# Patient Record
Sex: Female | Born: 1955 | Hispanic: No | Marital: Married | Smoking: Never smoker
Health system: Southern US, Community
[De-identification: ages and names within clinical notes are randomized; demographics above are authoritative.]

## PROBLEM LIST (undated history)

## (undated) DIAGNOSIS — E039 Hypothyroidism, unspecified: Secondary | ICD-10-CM

## (undated) DIAGNOSIS — D649 Anemia, unspecified: Secondary | ICD-10-CM

## (undated) DIAGNOSIS — M109 Gout, unspecified: Secondary | ICD-10-CM

## (undated) DIAGNOSIS — E785 Hyperlipidemia, unspecified: Secondary | ICD-10-CM

## (undated) DIAGNOSIS — M1990 Unspecified osteoarthritis, unspecified site: Secondary | ICD-10-CM

## (undated) HISTORY — DX: Hypothyroidism, unspecified: E03.9

## (undated) HISTORY — DX: Anemia, unspecified: D64.9

## (undated) HISTORY — DX: Hyperlipidemia, unspecified: E78.5

## (undated) HISTORY — DX: Unspecified osteoarthritis, unspecified site: M19.90

## (undated) HISTORY — DX: Gout, unspecified: M10.9

## (undated) HISTORY — PX: NO PAST SURGERIES: SHX2092

---

## 2017-06-09 DIAGNOSIS — B009 Herpesviral infection, unspecified: Secondary | ICD-10-CM | POA: Insufficient documentation

## 2018-06-07 DIAGNOSIS — M9902 Segmental and somatic dysfunction of thoracic region: Secondary | ICD-10-CM | POA: Diagnosis not present

## 2018-06-07 DIAGNOSIS — M9903 Segmental and somatic dysfunction of lumbar region: Secondary | ICD-10-CM | POA: Diagnosis not present

## 2018-06-07 DIAGNOSIS — M47892 Other spondylosis, cervical region: Secondary | ICD-10-CM | POA: Diagnosis not present

## 2018-06-07 DIAGNOSIS — M9901 Segmental and somatic dysfunction of cervical region: Secondary | ICD-10-CM | POA: Diagnosis not present

## 2018-06-13 DIAGNOSIS — M9901 Segmental and somatic dysfunction of cervical region: Secondary | ICD-10-CM | POA: Diagnosis not present

## 2018-06-13 DIAGNOSIS — M47892 Other spondylosis, cervical region: Secondary | ICD-10-CM | POA: Diagnosis not present

## 2018-06-13 DIAGNOSIS — M9902 Segmental and somatic dysfunction of thoracic region: Secondary | ICD-10-CM | POA: Diagnosis not present

## 2018-06-13 DIAGNOSIS — M9903 Segmental and somatic dysfunction of lumbar region: Secondary | ICD-10-CM | POA: Diagnosis not present

## 2018-07-15 DIAGNOSIS — M797 Fibromyalgia: Secondary | ICD-10-CM | POA: Diagnosis not present

## 2018-07-15 DIAGNOSIS — E039 Hypothyroidism, unspecified: Secondary | ICD-10-CM | POA: Diagnosis not present

## 2018-07-15 DIAGNOSIS — M109 Gout, unspecified: Secondary | ICD-10-CM | POA: Diagnosis not present

## 2018-07-15 DIAGNOSIS — M1A9XX0 Chronic gout, unspecified, without tophus (tophi): Secondary | ICD-10-CM | POA: Diagnosis not present

## 2018-07-20 ENCOUNTER — Encounter: Payer: Self-pay | Admitting: Nurse Practitioner

## 2018-07-20 ENCOUNTER — Ambulatory Visit: Payer: BLUE CROSS/BLUE SHIELD | Admitting: Nurse Practitioner

## 2018-07-20 VITALS — BP 100/70 | HR 78 | Ht 65.75 in | Wt 188.0 lb

## 2018-07-20 DIAGNOSIS — H6690 Otitis media, unspecified, unspecified ear: Secondary | ICD-10-CM

## 2018-07-20 DIAGNOSIS — H6121 Impacted cerumen, right ear: Secondary | ICD-10-CM

## 2018-07-20 MED ORDER — AZITHROMYCIN 250 MG PO TABS: ORAL_TABLET | ORAL | 0 refills | Status: DC

## 2018-07-20 NOTE — Progress Notes (Signed)
Name: Onell Mcmath   MRN: [id number]    DOB: 11-24-1956   Date:07/20/2018       Progress Note  Subjective  Chief Complaint  Ear pain  HPI  Ms Westfall is here today for evaluation of an acute complaint of right ear pain and pressure, which began about 2 days ago. She says her ear "Feels full and the sound is muffled." She recently moved to [facility], has noticed some postnasal drip and occasionally sneezing since her move here. Overall she feels well, denies fevers, weakness, headaches, ear drainage, left ear pain or pressure, sinus pressure, nasal congestion, sore throat, cough, chest pain, shortness of breath. She has had an ear infection once or twice, but many years ago. She has not recently been swimming. She does not place qtips or objects in ears.   There are no active problems to display for this patient.   Social History   Tobacco Use  . Smoking status: Never Smoker  . Smokeless tobacco: Former Network engineer Use Topics  . Alcohol use: Never    Frequency: Never     Current Outpatient Medications:  .  acyclovir (ZOVIRAX) 800 MG tablet, Take by mouth as needed. , Disp: , Rfl:  .  allopurinol (ZYLOPRIM) 300 MG tablet, Take 300 mg by mouth daily. , Disp: , Rfl:  .  amitriptyline (ELAVIL) 10 MG tablet, Take 10 mg by mouth at bedtime. , Disp: , Rfl:  .  thyroid (ARMOUR) 60 MG tablet, Take 1 tablet by mouth daily., Disp: , Rfl:   Allergies  Allergen Reactions  . Penicillins Anaphylaxis    ROS   No other specific complaints in a complete review of systems (except as listed in HPI above).  Objective  Vitals:   07/20/18 1023  BP: 100/70  Pulse: 78  SpO2: 95%  Weight: 188 lb (85.3 kg)  Height: 5' 5.75" (1.67 m)    Body mass index is 30.58 kg/m.  Nursing Note and Vital Signs reviewed.  Physical Exam  Constitutional: Patient appears well-developed and well-nourished.  No distress.  HEENT: head atraumatic, normocephalic, pupils equal and reactive to light, EOM's  intact, left TM without erythema or bulging, right TM opaque, visualized after cerumen disimpaction, right external ear canal erythematous, no maxillary or frontal sinus tenderness , neck supple without lymphadenopathy, oropharynx pink and moist without exudate Cardiovascular: Normal rate, regular rhythm, S1/S2 present.  Distal pulses intact. Pulmonary/Chest: Effort normal and breath sounds clear. No respiratory distress or retractions. Neurological: She is alert and oriented to person, place, and time. No cranial nerve deficit. Coordination, balance, strength, speech and gait are normal.  Skin: Skin is warm and dry. No rash noted. No erythema.  Psychiatric: Patient has a normal mood and affect. behavior is normal. Judgment and thought content normal.   Assessment & Plan F/U for establish care visit as scheduled, or sooner if needed  Impacted cerumen of right ear irrigation/lavage of right ear with removal of impacted cerumen Patient tolerated well F/u for new or worsening symptoms  Acute otitis media, unspecified otitis media type Due to penicillin allergy, azithromycin course prescribed and side effects discussed -Red flags and when to present for emergency care or RTC including fever >101.52F,  new/worsening/un-resolving symptoms, reviewed with patient at time of visit. Follow up and care instructions discussed and provided in AVS. - azithromycin (ZITHROMAX) 250 MG tablet; 2 tablets today, then 1 tablet daily until complete  Dispense: 6 tablet; Refill: 0

## 2018-07-20 NOTE — Addendum Note (Signed)
Addended by: Lance Sell on: 07/20/2018 12:43 PM   Modules accepted: Level of Service

## 2018-07-20 NOTE — Patient Instructions (Addendum)
I have sent z-pak to your pharmacy for your ear.  Please follow up for fevers >101, worsening symptoms or if symptoms persist after z-pak.  It was nice to meet you. Welcome to [facility]!   Otitis Media, Adult Otitis media is redness, soreness, and puffiness (swelling) in the space just behind your eardrum (middle ear). It may be caused by allergies or infection. It often happens along with a cold. Follow these instructions at home:  Take your medicine as told. Finish it even if you start to feel better.  Only take over-the-counter or prescription medicines for pain, discomfort, or fever as told by your doctor.  Follow up with your doctor as told. Contact a doctor if:  You have otitis media only in one ear, or bleeding from your nose, or both.  You notice a lump on your neck.  You are not getting better in 3-5 days.  You feel worse instead of better. Get help right away if:  You have pain that is not helped with medicine.  You have puffiness, redness, or pain around your ear.  You get a stiff neck.  You cannot move part of your face (paralysis).  You notice that the bone behind your ear hurts when you touch it. This information is not intended to replace advice given to you by your health care provider. Make sure you discuss any questions you have with your health care provider. Document Released: 05/04/2008 Document Revised: 04/23/2016 Document Reviewed: 06/13/2013 Elsevier Interactive Patient Education  2017 [employer].

## 2018-07-29 DIAGNOSIS — M722 Plantar fascial fibromatosis: Secondary | ICD-10-CM | POA: Diagnosis not present

## 2018-07-29 DIAGNOSIS — M7742 Metatarsalgia, left foot: Secondary | ICD-10-CM | POA: Diagnosis not present

## 2018-07-29 DIAGNOSIS — B070 Plantar wart: Secondary | ICD-10-CM | POA: Diagnosis not present

## 2018-08-11 DIAGNOSIS — M9901 Segmental and somatic dysfunction of cervical region: Secondary | ICD-10-CM | POA: Diagnosis not present

## 2018-08-11 DIAGNOSIS — M47892 Other spondylosis, cervical region: Secondary | ICD-10-CM | POA: Diagnosis not present

## 2018-08-11 DIAGNOSIS — M9903 Segmental and somatic dysfunction of lumbar region: Secondary | ICD-10-CM | POA: Diagnosis not present

## 2018-08-11 DIAGNOSIS — M9902 Segmental and somatic dysfunction of thoracic region: Secondary | ICD-10-CM | POA: Diagnosis not present

## 2018-08-30 ENCOUNTER — Telehealth: Payer: Self-pay

## 2018-08-30 ENCOUNTER — Encounter: Payer: Self-pay | Admitting: Family

## 2018-08-30 ENCOUNTER — Ambulatory Visit: Payer: BLUE CROSS/BLUE SHIELD | Admitting: Family

## 2018-08-30 VITALS — BP 106/78 | HR 77 | Temp 97.7°F | Ht 65.75 in | Wt 182.8 lb

## 2018-08-30 DIAGNOSIS — E039 Hypothyroidism, unspecified: Secondary | ICD-10-CM | POA: Diagnosis not present

## 2018-08-30 DIAGNOSIS — M109 Gout, unspecified: Secondary | ICD-10-CM | POA: Diagnosis not present

## 2018-08-30 DIAGNOSIS — Z1231 Encounter for screening mammogram for malignant neoplasm of breast: Secondary | ICD-10-CM

## 2018-08-30 DIAGNOSIS — Z13220 Encounter for screening for lipoid disorders: Secondary | ICD-10-CM | POA: Diagnosis not present

## 2018-08-30 DIAGNOSIS — E559 Vitamin D deficiency, unspecified: Secondary | ICD-10-CM

## 2018-08-30 DIAGNOSIS — Z23 Encounter for immunization: Secondary | ICD-10-CM | POA: Diagnosis not present

## 2018-08-30 DIAGNOSIS — M797 Fibromyalgia: Secondary | ICD-10-CM | POA: Diagnosis not present

## 2018-08-30 DIAGNOSIS — Z1211 Encounter for screening for malignant neoplasm of colon: Secondary | ICD-10-CM

## 2018-08-30 LAB — FECAL OCCULT BLOOD, GUAIAC: Fecal Occult Blood: NEGATIVE

## 2018-08-30 MED ORDER — ACYCLOVIR 800 MG PO TABS: 800.0000 mg | ORAL_TABLET | Freq: Every day | ORAL | Status: DC

## 2018-08-30 NOTE — Telephone Encounter (Signed)
Cologuard ordered today via website for patient.

## 2018-08-30 NOTE — Progress Notes (Signed)
[name] is a 61 y.o. female with the following history as recorded in EpicCare:  Patient Active Problem List   Diagnosis Date Noted  . Gout 08/30/2018  . Fibromyalgia 08/30/2018  . Hypothyroidism 08/30/2018    Current Outpatient Medications  Medication Sig Dispense Refill  . allopurinol (ZYLOPRIM) 300 MG tablet Take 300 mg by mouth daily.     Marland Kitchen amitriptyline (ELAVIL) 10 MG tablet Take 10 mg by mouth at bedtime.     Marland Kitchen thyroid (ARMOUR) 60 MG tablet Take 1 tablet by mouth daily.    Marland Kitchen acyclovir (ZOVIRAX) 800 MG tablet Take 1 tablet (800 mg total) by mouth 5 (five) times daily for 7 days. 37 tablet    No current facility-administered medications for this visit.     Allergies: Penicillins  No past medical history on file.  No past surgical history on file.  No family history on file.  Social History   Tobacco Use  . Smoking status: Never Smoker  . Smokeless tobacco: Former Network engineer Use Topics  . Alcohol use: Never    Frequency: Never    Subjective:  Patient presents to establish care; moved here from [place]- works as Theme park manager at [employer]. Div degree also;  history of gout, hypothyroidism, fibromyalgia; up to date on preventive health care needs; has known history of elevated cholesterol- opts not to treat; Up to date on  pap smear; Needs updated mammogram Does not want to do colonoscopy- prefers not to do colonoscopy; agrees to Cologuard;   Objective:  Vitals:   08/30/18 1046  BP: 106/78  Pulse: 77  Temp: 97.7 F (36.5 C)  TempSrc: Oral  SpO2: 97%  Weight: 182 lb 12.8 oz (82.9 kg)  Height: 5' 5.75" (1.67 m)    General: Well developed, well nourished, in no acute distress  Skin : Warm and dry.  Head: Normocephalic and atraumatic  Lungs: Respirations unlabored; clear to auscultation bilaterally without wheeze, rales, rhonchi  CVS exam: normal rate and regular rhythm.  Neurologic: Alert and oriented; speech intact; face symmetrical; moves  all extremities well; CNII-XII intact without focal deficit  Assessment:  1. Fibromyalgia   2. Hypothyroidism, unspecified type   3. Lipid screening   4. Gout of foot, unspecified cause, unspecified chronicity, unspecified laterality   5. Vitamin D deficiency   6. Screening mammogram, encounter for   7. Colon cancer screening   8. Need for influenza vaccination   9. Need for shingles vaccine     Plan:  Age appropriate preventive healthcare needs addressed; encouraged regular eye doctor and dental exams; encouraged regular exercise; will update labs and refills as needed today; follow-up to be determined; Flu given; Shingrix #1 given- return in 2-3 months; Order for mammogram, Cologuard updated;    No follow-ups on file.  Orders Placed This Encounter  Procedures  . MM Digital Screening    Standing Status:   Future    Standing Expiration Date:   10/31/2019    Order Specific Question:   Reason for Exam (SYMPTOM  OR DIAGNOSIS REQUIRED)    Answer:   screening colonoscopy    Order Specific Question:   Is the patient pregnant?    Answer:   No    Order Specific Question:   Preferred imaging location?    Answer:   [facility]  . Flu Vaccine QUAD 36+ mos IM  . Varicella-zoster vaccine IM  . CBC w/Diff    Standing Status:   Future  Standing Expiration Date:   08/30/2019  . Comp Met (CMET)    Standing Status:   Future    Standing Expiration Date:   08/30/2019  . Lipid panel    Standing Status:   Future    Standing Expiration Date:   08/31/2019  . TSH    Standing Status:   Future    Standing Expiration Date:   08/30/2019  . Vitamin D (25 hydroxy)    Standing Status:   Future    Standing Expiration Date:   08/30/2019  . Uric acid    Standing Status:   Future    Standing Expiration Date:   08/30/2019  . Cologuard    Standing Status:   Future    Standing Expiration Date:   08/31/2019    Requested Prescriptions   Signed Prescriptions Disp Refills  . acyclovir (ZOVIRAX) 800 MG  tablet 37 tablet     Sig: Take 1 tablet (800 mg total) by mouth 5 (five) times daily for 7 days.

## 2018-08-30 NOTE — Patient Instructions (Signed)
[email or web address]

## 2018-10-20 ENCOUNTER — Ambulatory Visit: Payer: BLUE CROSS/BLUE SHIELD

## 2018-11-30 DIAGNOSIS — M48061 Spinal stenosis, lumbar region without neurogenic claudication: Secondary | ICD-10-CM

## 2018-11-30 HISTORY — DX: Spinal stenosis, lumbar region without neurogenic claudication: M48.061

## 2018-12-01 ENCOUNTER — Encounter: Payer: Self-pay | Admitting: Radiology

## 2018-12-01 ENCOUNTER — Ambulatory Visit
Admission: RE | Admit: 2018-12-01 | Discharge: 2018-12-01 | Disposition: A | Discharge status: Home / Self Care | Payer: BLUE CROSS/BLUE SHIELD | Source: Ambulatory Visit | Attending: Family | Admitting: Family

## 2018-12-01 DIAGNOSIS — Z1231 Encounter for screening mammogram for malignant neoplasm of breast: Secondary | ICD-10-CM

## 2018-12-05 DIAGNOSIS — Z1211 Encounter for screening for malignant neoplasm of colon: Secondary | ICD-10-CM | POA: Diagnosis not present

## 2018-12-05 DIAGNOSIS — R5383 Other fatigue: Secondary | ICD-10-CM | POA: Diagnosis not present

## 2018-12-05 LAB — COLOGUARD

## 2018-12-12 NOTE — Progress Notes (Signed)
Outside notes received. Information abstracted. Notes sent to scan.  

## 2018-12-27 ENCOUNTER — Telehealth: Payer: Self-pay | Admitting: Family

## 2018-12-27 ENCOUNTER — Ambulatory Visit: Payer: BLUE CROSS/BLUE SHIELD | Admitting: Family

## 2018-12-27 ENCOUNTER — Ambulatory Visit (INDEPENDENT_AMBULATORY_CARE_PROVIDER_SITE_OTHER)
Admission: RE | Admit: 2018-12-27 | Discharge: 2018-12-27 | Disposition: A | Discharge status: Home / Self Care | Payer: BLUE CROSS/BLUE SHIELD | Source: Ambulatory Visit | Attending: Family | Admitting: Family

## 2018-12-27 ENCOUNTER — Encounter: Payer: Self-pay | Admitting: Family

## 2018-12-27 VITALS — BP 118/74 | HR 79 | Temp 98.2°F | Ht 65.75 in | Wt 182.1 lb

## 2018-12-27 DIAGNOSIS — R202 Paresthesia of skin: Secondary | ICD-10-CM

## 2018-12-27 DIAGNOSIS — R200 Anesthesia of skin: Secondary | ICD-10-CM | POA: Diagnosis not present

## 2018-12-27 DIAGNOSIS — M545 Low back pain: Secondary | ICD-10-CM | POA: Diagnosis not present

## 2018-12-27 MED ORDER — METHYLPREDNISOLONE 4 MG PO TBPK: ORAL_TABLET | ORAL | 0 refills | Status: DC

## 2018-12-27 MED ORDER — ACYCLOVIR 800 MG PO TABS: 800.0000 mg | ORAL_TABLET | Freq: Four times a day (QID) | ORAL | 1 refills | Status: DC

## 2018-12-27 MED ORDER — TRAMADOL HCL 50 MG PO TABS: 50.0000 mg | ORAL_TABLET | Freq: Three times a day (TID) | ORAL | 0 refills | Status: DC | PRN

## 2018-12-27 NOTE — Telephone Encounter (Signed)
Spoke with Shanon Brow and info given.

## 2018-12-27 NOTE — Telephone Encounter (Signed)
Can you please ask her to hold her Amitriptyline while she is using the Tramadol for pain?

## 2018-12-27 NOTE — Telephone Encounter (Signed)
Copied from [place] [id number]. Topic: Quick Communication - Rx Refill/Question >> Dec 27, 2018 11:48 AM Berneta Levins wrote: Medication:   [name] from [place] calling.  States there is a drug to drug interaction with the traMADol (ULTRAM) 50 MG tablet that was Rxd by Jodi Mourning.   States that the interaction is with amitriptyline (ELAVIL) 10 MG tablet, and they need to speak with someone to give authorization to have the Tramadol filled.  Preferred Pharmacy (with phone number or street name): [name] at [place], [place] [phone] (Phone) [phone] (Fax)  Agent: Please be advised that RX refills may take up to 3 business days. We ask that you follow-up with your pharmacy.

## 2018-12-27 NOTE — Telephone Encounter (Signed)
Called and left message for patient regarding info.

## 2018-12-27 NOTE — Progress Notes (Signed)
[name] is a 63 y.o. female with the following history as recorded in EpicCare:  Patient Active Problem List   Diagnosis Date Noted  . Gout 08/30/2018  . Fibromyalgia 08/30/2018  . Hypothyroidism 08/30/2018    Current Outpatient Medications  Medication Sig Dispense Refill  . allopurinol (ZYLOPRIM) 300 MG tablet Take 300 mg by mouth daily.     Marland Kitchen amitriptyline (ELAVIL) 10 MG tablet Take 10 mg by mouth at bedtime.     Marland Kitchen thyroid (ARMOUR) 60 MG tablet Take 1 tablet by mouth daily.    Marland Kitchen acyclovir (ZOVIRAX) 800 MG tablet Take 1 tablet (800 mg total) by mouth 4 (four) times daily for 20 days. 40 tablet 1  . methylPREDNISolone (MEDROL DOSEPAK) 4 MG TBPK tablet Taper as directed 21 tablet 0  . traMADol (ULTRAM) 50 MG tablet Take 1 tablet (50 mg total) by mouth every 8 (eight) hours as needed. 20 tablet 0   No current facility-administered medications for this visit.     Allergies: Penicillins  History reviewed. No pertinent past medical history.  History reviewed. No pertinent surgical history.  Family History  Problem Relation Age of Onset  . Breast cancer Sister     Social History   Tobacco Use  . Smoking status: Never Smoker  . Smokeless tobacco: Former Network engineer Use Topics  . Alcohol use: Never    Frequency: Never    Subjective:  Patient presents with left sided sciatica pain x 10 days; complaining of numbness/ tingling noted in her left leg; has had sciatica in the past- notes that last flare was around 15 years ago; this particular flare seems different in that her left foot is numb. Has been working with chiropractor/ massage therapy/ accupuncture with limited benefit. No change in bowel or bladder habits; does feel like her psoas muscle is involved;     Objective:  Vitals:   12/27/18 0917  BP: 118/74  Pulse: 79  Temp: 98.2 F (36.8 C)  TempSrc: Oral  SpO2: 99%  Weight: 182 lb 1.9 oz (82.6 kg)  Height: 5' 5.75" (1.67 m)    General: Well developed,  well nourished, in no acute distress  Skin : Warm and dry.  Head: Normocephalic and atraumatic  Lungs: Respirations unlabored; clear to auscultation bilaterally without wheeze, rales, rhonchi  CVS exam: normal rate and regular rhythm.  Musculoskeletal: No deformities; no active joint inflammation  Extremities: No edema, cyanosis, clubbing  Vessels: Symmetric bilaterally  Neurologic: Alert and oriented; speech intact; face symmetrical; moves all extremities well; CNII-XII intact without focal deficit   Assessment:  1. Numbness and tingling of left lower extremity     Plan:  ? Possible herniated disc vs sciatica; update imaging today; Rx for Medrol Dose Pak and Tramadol; patient has been informed to hold the amitriptyline while on this medication; follow-up to be determied.   No follow-ups on file.  Orders Placed This Encounter  Procedures  . DG Lumbar Spine Complete    Standing Status:   Future    Number of Occurrences:   1    Standing Expiration Date:   02/25/2020    Order Specific Question:   Reason for Exam (SYMPTOM  OR DIAGNOSIS REQUIRED)    Answer:   low back pain with numbness and tingling    Order Specific Question:   Is patient pregnant?    Answer:   No    Order Specific Question:   Preferred imaging location?    Answer:   [address]    Order Specific Question:   Radiology Contrast Protocol - do NOT remove file path    Answer:   [email or web address]    Requested Prescriptions   Signed Prescriptions Disp Refills  . acyclovir (ZOVIRAX) 800 MG tablet 40 tablet 1    Sig: Take 1 tablet (800 mg total) by mouth 4 (four) times daily for 20 days.  . methylPREDNISolone (MEDROL DOSEPAK) 4 MG TBPK tablet 21 tablet 0    Sig: Taper as directed  . traMADol (ULTRAM) 50 MG tablet 20 tablet 0    Sig: Take 1 tablet (50 mg total) by mouth every 8 (eight) hours as needed.

## 2018-12-28 ENCOUNTER — Other Ambulatory Visit: Payer: Self-pay | Admitting: Family

## 2018-12-28 DIAGNOSIS — M47892 Other spondylosis, cervical region: Secondary | ICD-10-CM | POA: Diagnosis not present

## 2018-12-28 DIAGNOSIS — M9902 Segmental and somatic dysfunction of thoracic region: Secondary | ICD-10-CM | POA: Diagnosis not present

## 2018-12-28 DIAGNOSIS — M9901 Segmental and somatic dysfunction of cervical region: Secondary | ICD-10-CM | POA: Diagnosis not present

## 2018-12-28 DIAGNOSIS — M9903 Segmental and somatic dysfunction of lumbar region: Secondary | ICD-10-CM | POA: Diagnosis not present

## 2018-12-28 DIAGNOSIS — M5116 Intervertebral disc disorders with radiculopathy, lumbar region: Secondary | ICD-10-CM

## 2018-12-30 ENCOUNTER — Telehealth: Payer: Self-pay

## 2018-12-30 NOTE — Telephone Encounter (Signed)
-----   Message from Octavio Manns sent at 12/30/2018  8:27 AM EST ----- Regarding: RE: MRI request Pre cert has been approved and [facility] will contact her to schedule or she can call them at [phone]  Thanks Lori ----- Message ----- From: Marcina Millard, CMA Sent: 12/29/2018   4:35 PM EST To: Shari Prows Subject: MRI request                                    Hi Ladies,  When you get a second can you let me know when patient's MRI has been approved so she can schedule her appointment? She is rather anxious and leg pain is intense.  Thanks, [employer]

## 2018-12-30 NOTE — Telephone Encounter (Signed)
Called and left message for patient that she can call [facility] and schedule appointment herself if she didn't want to wait to be called.

## 2019-01-02 NOTE — Progress Notes (Signed)
[name] [address] [place], [place] [address] Phone: [phone] Subjective:    I'm seeing this patient by the request  of:  Marrian Salvage, FNP   CC: Back pain   I, Jacqualin Combes, am serving as a scribe for Dr. Hulan Saas.  RDE:[id number]  [name] is a 63 y.o. female coming in with complaint of back pain. Patient states that her left foot has been going numb for the past 2 weeks. Was having pain in the left glute and into the front of the leg for 3 days initially. The pain started traveling lower and one week after that she had numbness in the entire foot. Did go hiking and had shoes tighter than usual and now has a bruise. Is on prednisone and Tramadol. Does practice yoga and tries to stretch which does not help. Has seen a chiropractor who told her she has a fractured vertebra and spinal stenosis.    Patient did have back x-rays taken December 27, 2018.  These were independently visualized by me.  Found to have degenerative changes from L4-S1 moderate in nature.  No past medical history on file. No past surgical history on file. Social History   Socioeconomic History  . Marital status: Unknown    Spouse name: Not on file  . Number of children: Not on file  . Years of education: Not on file  . Highest education level: Not on file  Occupational History  . Not on file  Social Needs  . Financial resource strain: Not on file  . Food insecurity:    Worry: Not on file    Inability: Not on file  . Transportation needs:    Medical: Not on file    Non-medical: Not on file  Tobacco Use  . Smoking status: Never Smoker  . Smokeless tobacco: Former Network engineer and Sexual Activity  . Alcohol use: Never    Frequency: Never  . Drug use: Never  . Sexual activity: Not on file  Lifestyle  . Physical activity:    Days per week: Not on file    Minutes per session: Not on file  . Stress: Not on file  Relationships  . Social  connections:    Talks on phone: Not on file    Gets together: Not on file    Attends religious service: Not on file    Active member of club or organization: Not on file    Attends meetings of clubs or organizations: Not on file    Relationship status: Not on file  Other Topics Concern  . Not on file  Social History Narrative  . Not on file   Allergies  Allergen Reactions  . Penicillins Anaphylaxis   Family History  Problem Relation Age of Onset  . Breast cancer Sister     Current Outpatient Medications (Endocrine & Metabolic):  .  methylPREDNISolone (MEDROL DOSEPAK) 4 MG TBPK tablet, Taper as directed .  thyroid (ARMOUR) 60 MG tablet, Take 1 tablet by mouth daily.    Current Outpatient Medications (Analgesics):  .  allopurinol (ZYLOPRIM) 300 MG tablet, Take 300 mg by mouth daily.  .  traMADol (ULTRAM) 50 MG tablet, Take 1 tablet (50 mg total) by mouth every 8 (eight) hours as needed.   Current Outpatient Medications (Other):  .  acyclovir (ZOVIRAX) 800 MG tablet, Take 1 tablet (800 mg total) by mouth 4 (four) times daily for 20 days. Marland Kitchen  amitriptyline (ELAVIL) 10 MG  tablet, Take 10 mg by mouth at bedtime.     Past medical history, social, surgical and family history all reviewed in electronic medical record.  No pertanent information unless stated regarding to the chief complaint.   Review of Systems:  No headache, visual changes, nausea, vomiting, diarrhea, constipation, dizziness, abdominal pain, skin rash, fevers, chills, night sweats, weight loss, swollen lymph nodes, , chest pain, shortness of breath, mood changes.  Other muscle aches, body aches  Objective  There were no vitals taken for this visit. Systems examined below as of    General: No apparent distress alert and oriented x3 mood and affect normal, dressed appropriately.  HEENT: Pupils equal, extraocular movements intact  Respiratory: Patient's speak in full sentences and does not appear short of breath    Cardiovascular: No lower extremity edema, non tender, no erythema  Skin: Warm dry intact with no signs of infection or rash on extremities or on axial skeleton.  Abdomen: Soft nontender  Neuro: Cranial nerves II through XII are intact, neurovascularly intact in all extremities with 2+ DTRs and 2+ pulses.  Lymph: No lymphadenopathy of posterior or anterior cervical chain or axillae bilaterally.  Gait normal with good balance and coordination.  MSK:  Non tender with full range of motion and good stability and symmetric strength and tone of shoulders, elbows, wrist, hip, knee and ankles bilaterally.  Back Exam:  Inspection: Localized 5 degrees of extension of the back Motion: Flexion 35 deg, Extension 25 deg, Side Bending to 35 deg bilaterally,  Rotation to 35 deg bilaterally  SLR laying: Positive left side 20 degrees XSLR laying: Positive left Palpable tenderness: Diffusely tender to palpation of the paraspinal musculature lumbar spine pain seems to be out of proportion to the amount of light palpation. FABER: negative. Sensory change: Gross sensation intact to all lumbar and sacral dermatomes.  Reflexes: 2+ at both patellar tendons, 2+ at achilles tendons, Babinski's downgoing.  Point of time compared to the contralateral side  [id number]; 15 additional minutes spent for Therapeutic exercises as stated in above notes.  This included exercises focusing on stretching, strengthening, with significant focus on eccentric aspects.   Long term goals include an improvement in range of motion, strength, endurance as well as avoiding reinjury. Patient's frequency would include in 1-2 times a day, 3-5 times a week for a duration of 6-12 weeks. Low back exercises that included:  Pelvic tilt/bracing instruction to focus on control of the pelvic girdle and lower abdominal muscles  Glute strengthening exercises, focusing on proper firing of the glutes without engaging the low back muscles Proper stretching  techniques for maximum relief for the hamstrings, hip flexors, low back and some rotation where tolerated    Proper technique shown and discussed handout in great detail with ATC.  All questions were discussed and answered.     Impression and Recommendations:     This case required medical decision making of moderate complexity. The above documentation has been reviewed and is accurate and complete Lyndal Pulley, DO       Note: This dictation was prepared with Dragon dictation along with smaller phrase technology. Any transcriptional errors that result from this process are unintentional.

## 2019-01-03 ENCOUNTER — Ambulatory Visit: Payer: BLUE CROSS/BLUE SHIELD | Admitting: Family Medicine

## 2019-01-03 ENCOUNTER — Encounter: Payer: Self-pay | Admitting: Family Medicine

## 2019-01-03 DIAGNOSIS — M5416 Radiculopathy, lumbar region: Secondary | ICD-10-CM | POA: Insufficient documentation

## 2019-01-03 MED ORDER — VITAMIN D (ERGOCALCIFEROL) 1.25 MG (50000 UNIT) PO CAPS: 50000.0000 [IU] | ORAL_CAPSULE | ORAL | 0 refills | Status: DC

## 2019-01-03 MED ORDER — GABAPENTIN 100 MG PO CAPS: 200.0000 mg | ORAL_CAPSULE | Freq: Every day | ORAL | 3 refills | Status: DC

## 2019-01-03 NOTE — Assessment & Plan Note (Signed)
Patient does have some radicular symptoms.  Concern for potential letter for nerve impingement.  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  Started gabapentin, encouraged him to potentially finish the prednisone.  Patient is scheduled for an MRI secondary to the weakness of the foot and I think that this is fine.  Could be a candidate for possible epidurals or nerve root injections.  Do not feel that this will likely be surgical.  Patient will follow-up after the MRI and will discuss treatment options.

## 2019-01-03 NOTE — Patient Instructions (Addendum)
Good to see you  Lets see what the MRI shows Can stop the prednisone daily  Gabapentin 200mg  at night  Once weekly vitamin D for 8 weeks Continue the allopurinol, tart cherry extract  I will write you after the MRI and discuss options.

## 2019-01-06 ENCOUNTER — Ambulatory Visit
Admission: RE | Admit: 2019-01-06 | Discharge: 2019-01-06 | Disposition: A | Discharge status: Home / Self Care | Payer: BLUE CROSS/BLUE SHIELD | Source: Ambulatory Visit | Attending: Family | Admitting: Family

## 2019-01-06 DIAGNOSIS — M5116 Intervertebral disc disorders with radiculopathy, lumbar region: Secondary | ICD-10-CM

## 2019-01-06 DIAGNOSIS — M545 Low back pain: Secondary | ICD-10-CM | POA: Diagnosis not present

## 2019-01-10 ENCOUNTER — Telehealth: Payer: Self-pay

## 2019-01-10 ENCOUNTER — Ambulatory Visit: Payer: BLUE CROSS/BLUE SHIELD

## 2019-01-10 NOTE — Telephone Encounter (Signed)
Patient was in office for B12 injection. Inquired about MRI results. Looks like [name] ordered the result and read it but patient was transitioned to Dr. Thompson [name] care for this issue. Told patient that we would contact her once they were read by Dr. Tamala Julian.

## 2019-01-10 NOTE — Telephone Encounter (Signed)
Talked with patient---she did not see laura's comments on mychart---I have explained laura's note/instructions---patient would like to see dr Tamala Julian again first before deciding on neurosurgery/injections referral---patient is making appt. With dr Tamala Julian today--she did not get 2nd shingrix vaccine today---just completed Acyclovir tx one week ago---I have advised patient to wait one month to make sure shingrix vaccine works appropriately---nurse visit appt made for 2nd shingrix----routing to [place], fyi.Marland KitchenMarland KitchenMarland Kitchen

## 2019-01-12 ENCOUNTER — Ambulatory Visit: Payer: BLUE CROSS/BLUE SHIELD | Admitting: Family Medicine

## 2019-01-12 ENCOUNTER — Encounter: Payer: Self-pay | Admitting: Family Medicine

## 2019-01-12 VITALS — BP 130/90 | HR 77 | Ht 67.0 in | Wt 186.0 lb

## 2019-01-12 DIAGNOSIS — M545 Low back pain, unspecified: Secondary | ICD-10-CM

## 2019-01-12 DIAGNOSIS — M5416 Radiculopathy, lumbar region: Secondary | ICD-10-CM

## 2019-01-12 NOTE — Progress Notes (Signed)
[name] [place], [place] [address] Phone: [phone] Subjective:    I [name] am serving as a Education administrator for Dr. Hulan Saas.   I'm seeing this patient by the request  of:    CC: Back pain follow-up  [id number]  [name] is a 63 y.o. female coming in with complaint of back pain. Wants to discuss MRI results. States that her foot is still numb.  Patient is concerned because she does not feel that the numbness is helping.  Patient is denies though any type of weakness.  States that the pain in the back is intermittent but not as severe.  Does feel the prednisone made some difference but is concerned because she has gained weight.  Patient is also concerned because she has not been active and still not making any improvement.  Patient did have an MRI done of the back in January 06, 2019.  Found to have facet osteoarthrosis with 3 mm of anterolisthesis at L4-L5.  There is some neural compression at either side of the facet and foramen with mild spinal stenosis.  Possible nerve irritation L5-S1 left side  No past medical history on file. No past surgical history on file. Social History   Socioeconomic History  . Marital status: Unknown    Spouse name: Not on file  . Number of children: Not on file  . Years of education: Not on file  . Highest education level: Not on file  Occupational History  . Not on file  Social Needs  . Financial resource strain: Not on file  . Food insecurity:    Worry: Not on file    Inability: Not on file  . Transportation needs:    Medical: Not on file    Non-medical: Not on file  Tobacco Use  . Smoking status: Never Smoker  . Smokeless tobacco: Former Network engineer and Sexual Activity  . Alcohol use: Never    Frequency: Never  . Drug use: Never  . Sexual activity: Not on file  Lifestyle  . Physical activity:    Days per week: Not on file    Minutes per session: Not on file  .  Stress: Not on file  Relationships  . Social connections:    Talks on phone: Not on file    Gets together: Not on file    Attends religious service: Not on file    Active member of club or organization: Not on file    Attends meetings of clubs or organizations: Not on file    Relationship status: Not on file  Other Topics Concern  . Not on file  Social History Narrative  . Not on file   Allergies  Allergen Reactions  . Penicillins Anaphylaxis   Family History  Problem Relation Age of Onset  . Breast cancer Sister     Current Outpatient Medications (Endocrine & Metabolic):  .  methylPREDNISolone (MEDROL DOSEPAK) 4 MG TBPK tablet, Taper as directed .  thyroid (ARMOUR) 60 MG tablet, Take 1 tablet by mouth daily.    Current Outpatient Medications (Analgesics):  .  allopurinol (ZYLOPRIM) 300 MG tablet, Take 300 mg by mouth daily.  .  traMADol (ULTRAM) 50 MG tablet, Take 1 tablet (50 mg total) by mouth every 8 (eight) hours as needed.   Current Outpatient Medications (Other):  .  acyclovir (ZOVIRAX) 800 MG tablet, Take 1 tablet (800 mg total) by mouth 4 (four) times daily for 20 days. Marland Kitchen  amitriptyline (ELAVIL) 10 MG tablet, Take 10 mg by mouth at bedtime.  .  gabapentin (NEURONTIN) 100 MG capsule, Take 2 capsules (200 mg total) by mouth at bedtime. .  Vitamin D, Ergocalciferol, (DRISDOL) 1.25 MG (50000 UT) CAPS capsule, Take 1 capsule (50,000 Units total) by mouth every 7 (seven) days.    Past medical history, social, surgical and family history all reviewed in electronic medical record.  No pertanent information unless stated regarding to the chief complaint.   Review of Systems:  No headache, visual changes, nausea, vomiting, diarrhea, constipation, dizziness, abdominal pain, skin rash, fevers, chills, night sweats, weight loss, swollen lymph nodes, body aches, joint swelling, , chest pain, shortness of breath, mood changes.  Positive muscle aches  Objective  Blood  pressure 130/90, pulse 77, height 5\' 7"  (1.702 m), weight 186 lb (84.4 kg), SpO2 96 %.   General: No apparent distress alert and oriented x3 mood and affect normal, dressed appropriately.  HEENT: Pupils equal, extraocular movements intact  Respiratory: Patient's speak in full sentences and does not appear short of breath  Cardiovascular: No lower extremity edema, non tender, no erythema  Skin: Warm dry intact with no signs of infection or rash on extremities or on axial skeleton.  Abdomen: Soft nontender  Neuro: Cranial nerves II through XII are intact, neurovascularly intact in all extremities with 2+ DTRs and 2+ pulses.  Lymph: No lymphadenopathy of posterior or anterior cervical chain or axillae bilaterally.  Gait normal with good balance and coordination.  MSK:  Non tender with full range of motion and good stability and symmetric strength and tone of shoulders, elbows, wrist, hip, knee and ankles bilaterally.  Back Exam:  Inspection: Unremarkable  Motion: Flexion 25 deg, Extension 25 deg, Side Bending to 45 deg bilaterally,  Rotation to 45 deg bilaterally  SLR laying: Mild positive left leg in the L5 distribution XSLR laying: Negative  Palpable tenderness: Tender to palpation of the paraspinal musculature lumbar spine left greater than right. FABER: negative. Sensory change: Gross sensation intact to all lumbar and sacral dermatomes.  Reflexes: 2+ at both patellar tendons, 2+ at achilles tendons, Babinski's downgoing.  Strength at foot  Plantar-flexion: 5/5 Dorsi-flexion: 5/5 Eversion: 5/5 Inversion: 5/5  Leg strength  Quad: 5/5 Hamstring: 5/5 Hip flexor: 5/5 Hip abductors: 5/5       Impression and Recommendations:     This case required medical decision making of moderate complexity. The above documentation has been reviewed and is accurate and complete Lyndal Pulley, DO       Note: This dictation was prepared with Dragon dictation along with smaller phrase technology.  Any transcriptional errors that result from this process are unintentional.

## 2019-01-12 NOTE — Patient Instructions (Signed)
Good to see you  See me again 2 weeks AFTER the epidural  Call [phone] to schedule the epidural then call us to make an appointment  OK to do baby cobra.  Biking, swimming ok for now OK to walk in 3 days after the epidural  Once I see you then we will get you into PT

## 2019-01-12 NOTE — Assessment & Plan Note (Signed)
Reviewing patient's MRI as well as with patient symptoms of the left leg radiculopathy that seems to be more in the L5 distribution we discussed where to do the potential injection.  With the MRI not showing a true L5 nerve impingement at the moment though I would like to start with an epidural at L4-L5.  Patient is agreement with the plan.  Has tramadol for breakthrough pain, encourage patient to try to increase the gabapentin to 300 mg at night.  Patient will follow-up with me in 2 weeks after the epidural to see how patient responds.  Hopefully at that point we will start physical therapy

## 2019-01-13 ENCOUNTER — Other Ambulatory Visit: Payer: Self-pay | Admitting: Family

## 2019-01-13 ENCOUNTER — Encounter: Payer: Self-pay | Admitting: Family

## 2019-01-13 MED ORDER — AMITRIPTYLINE HCL 10 MG PO TABS: 10.0000 mg | ORAL_TABLET | Freq: Every day | ORAL | 3 refills | Status: DC

## 2019-01-17 ENCOUNTER — Encounter: Payer: Self-pay | Admitting: Family Medicine

## 2019-01-19 ENCOUNTER — Telehealth: Payer: Self-pay
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
## 2019-01-20 ENCOUNTER — Other Ambulatory Visit: Payer: BLUE CROSS/BLUE SHIELD

## 2019-01-20 ENCOUNTER — Inpatient Hospital Stay: Admission: RE | Admit: 2019-01-20 | Payer: BLUE CROSS/BLUE SHIELD | Source: Ambulatory Visit

## 2019-01-23 ENCOUNTER — Other Ambulatory Visit: Payer: Self-pay | Admitting: Family

## 2019-01-23 ENCOUNTER — Encounter: Payer: Self-pay | Admitting: Family

## 2019-01-23 ENCOUNTER — Ambulatory Visit: Payer: BLUE CROSS/BLUE SHIELD | Admitting: Family Medicine

## 2019-01-23 DIAGNOSIS — M5416 Radiculopathy, lumbar region: Secondary | ICD-10-CM

## 2019-01-23 MED ORDER — ALLOPURINOL 300 MG PO TABS: 300.0000 mg | ORAL_TABLET | Freq: Every day | ORAL | 3 refills | Status: DC
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
## 2019-01-26 ENCOUNTER — Ambulatory Visit
Admission: RE | Admit: 2019-01-26 | Discharge: 2019-01-26 | Disposition: A | Discharge status: Home / Self Care | Payer: BLUE CROSS/BLUE SHIELD | Source: Ambulatory Visit | Attending: Family Medicine | Admitting: Family Medicine

## 2019-01-26 DIAGNOSIS — M545 Low back pain, unspecified: Secondary | ICD-10-CM

## 2019-01-26 DIAGNOSIS — M48061 Spinal stenosis, lumbar region without neurogenic claudication: Secondary | ICD-10-CM | POA: Diagnosis not present

## 2019-01-26 MED ORDER — IOPAMIDOL (ISOVUE-M 200) INJECTION 41%
1.0000 mL | Freq: Once | INTRAMUSCULAR | Status: AC
  Administered 2019-01-26: 1 mL via EPIDURAL

## 2019-01-26 MED ORDER — METHYLPREDNISOLONE ACETATE 40 MG/ML INJ SUSP (RADIOLOG
120.0000 mg | Freq: Once | INTRAMUSCULAR | Status: AC
  Administered 2019-01-26: 120 mg via EPIDURAL

## 2019-01-26 NOTE — Discharge Instructions (Signed)

## 2019-02-02 ENCOUNTER — Telehealth: Payer: Self-pay

## 2019-02-02 MED ORDER — GABAPENTIN 100 MG PO CAPS: 200.0000 mg | ORAL_CAPSULE | Freq: Every day | ORAL | 3 refills | Status: DC

## 2019-02-02 NOTE — Telephone Encounter (Signed)
Copied from [place] [phone]. Topic: General - Other >> Feb 02, 2019  8:13 AM Carolyn Stare wrote:  Pt call to say she need a new RX sent to the pharmacy because her RX was increased to 3 capsules at night instead of 2   gabapentin (NEURONTIN) 100 MG capsule  [place]

## 2019-02-02 NOTE — Telephone Encounter (Signed)
Can you confirm if the pt is suppose to be taking gabapentin 300mg  qhs. It looks like you filled it for gabapentin 200mg  qhs.

## 2019-02-02 NOTE — Addendum Note (Signed)
Addended by: Lyndal Pulley on: 02/02/2019 04:00 PM   Modules accepted: Orders

## 2019-02-02 NOTE — Telephone Encounter (Signed)
Re-wrote it for 2-3 pills at night

## 2019-02-02 NOTE — Telephone Encounter (Addendum)
She needs to get this clarified by Dr. Tamala Julian. He wrote Rx for her at 2 per night at the end of February. He will have to refill the prescription for her.

## 2019-02-07 ENCOUNTER — Encounter: Payer: Self-pay | Admitting: Family Medicine

## 2019-02-07 ENCOUNTER — Ambulatory Visit: Payer: BLUE CROSS/BLUE SHIELD | Admitting: Family Medicine

## 2019-02-07 VITALS — BP 100/72 | HR 79 | Ht 67.0 in | Wt 181.0 lb

## 2019-02-07 DIAGNOSIS — M5416 Radiculopathy, lumbar region: Secondary | ICD-10-CM

## 2019-02-07 DIAGNOSIS — G8929 Other chronic pain: Secondary | ICD-10-CM

## 2019-02-07 DIAGNOSIS — M545 Low back pain: Secondary | ICD-10-CM
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
## 2019-02-07 NOTE — Patient Instructions (Signed)
God to see you  I am glad you are so much better PT will be calling you soon Ice is your friend I will touch base with Jinny Blossom  Note for your gym  Avoid excessive back extension  See me again in 4-6 weeks

## 2019-02-07 NOTE — Assessment & Plan Note (Signed)
Discussed with patient in great length.  80% better with the epidural.  Discussed which activities to do which was to avoid.  Increase activity.  Formal physical therapy will be beneficial for her to start increasing activity continue all meds.  Follow-up again in 4 to 6 weeks

## 2019-02-14 ENCOUNTER — Ambulatory Visit: Payer: BLUE CROSS/BLUE SHIELD

## 2019-02-14 ENCOUNTER — Ambulatory Visit: Payer: BLUE CROSS/BLUE SHIELD | Admitting: Physical Therapy

## 2019-02-24 ENCOUNTER — Encounter: Payer: Self-pay | Admitting: Family Medicine

## 2019-02-27 ENCOUNTER — Other Ambulatory Visit: Payer: Self-pay | Admitting: Family Medicine

## 2019-03-03 ENCOUNTER — Ambulatory Visit: Payer: BLUE CROSS/BLUE SHIELD

## 2019-03-14 ENCOUNTER — Encounter: Payer: Self-pay | Admitting: Family Medicine

## 2019-03-14 ENCOUNTER — Ambulatory Visit (INDEPENDENT_AMBULATORY_CARE_PROVIDER_SITE_OTHER): Payer: BLUE CROSS/BLUE SHIELD | Admitting: Family Medicine

## 2019-03-14 DIAGNOSIS — G8929 Other chronic pain: Secondary | ICD-10-CM

## 2019-03-14 DIAGNOSIS — M5416 Radiculopathy, lumbar region: Secondary | ICD-10-CM

## 2019-03-14 DIAGNOSIS — M545 Low back pain: Secondary | ICD-10-CM | POA: Diagnosis not present

## 2019-03-14 MED ORDER — GABAPENTIN 100 MG PO CAPS: 200.0000 mg | ORAL_CAPSULE | Freq: Three times a day (TID) | ORAL | 3 refills | Status: DC

## 2019-03-14 NOTE — Patient Instructions (Signed)
Good to see you  Ice 20 minutes 2 times daily. Usually after activity and before bed. Gabapentin up to 200mg  3 times a day

## 2019-03-14 NOTE — Progress Notes (Signed)
[name] [place], [place] [address] Phone: [phone] Subjective:    Virtual Visit via Video Note  I connected with [name] on 03/14/19 at  9:15 AM EDT by a video enabled telemedicine application and verified that I am speaking with the correct person using two identifiers.   I discussed the limitations of evaluation and management by telemedicine and the availability of in person appointments. The patient expressed understanding and agreed to proceed.  Patient was in her home residence.  I was in my office visit for the video visit.  That is all who attended    I discussed the assessment and treatment plan with the patient. The patient was provided an opportunity to ask questions and all were answered. The patient agreed with the plan and demonstrated an understanding of the instructions.   The patient was advised to call back or seek an in-person evaluation if the symptoms worsen or if the condition fails to improve as anticipated.  I provided 28 minutes of face-to-face time during this encounter over virtual platform   Lyndal Pulley, DO  More detailed notes   CC: back pain   FEO:[id number]  [name] is a 63 y.o. female coming in with complaint of patient does have significant past medical history for anxiety and fibromyalgia.  MRI did show L4-L5 degenerative disc disease with some stenosis.  Epidural was ordered on January 12, 2019.  Patient was feeling good and increasing activity.  Was continuing to see a chiropractor at the same time.  We are increasing her activity patient was to start physical therapy but unable to do so secondary to coronavirus outbreak.. Patient had been doing very well but unfortunately started having worsening pain again.  Radicular symptoms going all way down the right foot     No past medical history on file. No past surgical history on file. Social History   Socioeconomic  History  . Marital status: Unknown    Spouse name: Not on file  . Number of children: Not on file  . Years of education: Not on file  . Highest education level: Not on file  Occupational History  . Not on file  Social Needs  . Financial resource strain: Not on file  . Food insecurity:    Worry: Not on file    Inability: Not on file  . Transportation needs:    Medical: Not on file    Non-medical: Not on file  Tobacco Use  . Smoking status: Never Smoker  . Smokeless tobacco: Former Network engineer and Sexual Activity  . Alcohol use: Never    Frequency: Never  . Drug use: Never  . Sexual activity: Not on file  Lifestyle  . Physical activity:    Days per week: Not on file    Minutes per session: Not on file  . Stress: Not on file  Relationships  . Social connections:    Talks on phone: Not on file    Gets together: Not on file    Attends religious service: Not on file    Active member of club or organization: Not on file    Attends meetings of clubs or organizations: Not on file    Relationship status: Not on file  Other Topics Concern  . Not on file  Social History Narrative  . Not on file   Allergies  Allergen Reactions  . Penicillins Anaphylaxis   Family History  Problem Relation Age of  Onset  . Breast cancer Sister     Current Outpatient Medications (Endocrine & Metabolic):  .  methylPREDNISolone (MEDROL DOSEPAK) 4 MG TBPK tablet, Taper as directed .  thyroid (ARMOUR) 60 MG tablet, Take 1 tablet by mouth daily.    Current Outpatient Medications (Analgesics):  .  allopurinol (ZYLOPRIM) 300 MG tablet, Take 1 tablet (300 mg total) by mouth daily.   Current Outpatient Medications (Other):  .  amitriptyline (ELAVIL) 10 MG tablet, Take 1 tablet (10 mg total) by mouth at bedtime. .  gabapentin (NEURONTIN) 100 MG capsule, Take 2-3 capsules (200-300 mg total) by mouth at bedtime. .  Vitamin D, Ergocalciferol, (DRISDOL) 1.25 MG (50000 UT) CAPS capsule, TAKE 1  CAPSULE BY MOUTH EVERY 7 DAYS    Past medical history, social, surgical and family history all reviewed in electronic medical record.  No pertanent information unless stated regarding to the chief complaint.   Review of Systems:  No headache, visual changes, nausea, vomiting, diarrhea, constipation, dizziness, abdominal pain, skin rash, fevers, chills, night sweats, weight loss, swollen lymph nodes, body aches, joint swelling, , chest pain, shortness of breath, mood changes.  Positive muscle aches  Objective    General: No apparent distress alert and oriented x3 mood and affect normal, dressed appropriately.  HEENT: Pupils equal, extraocular movements intact  Respiratory: Patient's speak in full sentences and does not appear short of breath      Impression and Recommendations:     This case required medical decision making of moderate complexity. The above documentation has been reviewed and is accurate and complete Lyndal Pulley, DO       Note: This dictation was prepared with Dragon dictation along with smaller phrase technology. Any transcriptional errors that result from this process are unintentional.

## 2019-03-14 NOTE — Assessment & Plan Note (Signed)
Continues to have radicular symptoms.  We discussed the possibility of laboratory work-up the patient wants to hold until the coronavirus outbreak is done.  Discussed with patient home exercise, icing regimen, which activities of doing which wants to avoid.  Patient did respond initially to an epidural and I would like to repeat.  Increase gabapentin to 200 mg 3 times a day.  Patient will follow-up with me again virtual visit in 3 weeks.

## 2019-03-21 ENCOUNTER — Encounter: Payer: Self-pay | Admitting: Family

## 2019-03-21 ENCOUNTER — Other Ambulatory Visit: Payer: Self-pay | Admitting: Family

## 2019-03-21 DIAGNOSIS — N926 Irregular menstruation, unspecified: Secondary | ICD-10-CM

## 2019-03-24 ENCOUNTER — Other Ambulatory Visit: Payer: Self-pay | Admitting: Family

## 2019-03-24 DIAGNOSIS — N926 Irregular menstruation, unspecified: Secondary | ICD-10-CM

## 2019-03-27 ENCOUNTER — Other Ambulatory Visit: Payer: Self-pay | Admitting: Family

## 2019-04-10 NOTE — Progress Notes (Signed)
[name] [place], [place] [address] Phone: [phone] Subjective:    Virtual Visit via Video Note  I connected with [name] on 04/11/19 at  8:45 AM EDT by a video enabled telemedicine application and verified that I am speaking with the correct person using two identifiers.  Location: Patient: Patient was noted home resident Provider: I was in office setting   I discussed the limitations of evaluation and management by telemedicine and the availability of in person appointments. The patient expressed understanding and agreed to proceed.     I discussed the assessment and treatment plan with the patient. The patient was provided an opportunity to ask questions and all were answered. The patient agreed with the plan and demonstrated an understanding of the instructions.   The patient was advised to call back or seek an in-person evaluation if the symptoms worsen or if the condition fails to improve as anticipated.  I provided 26 minutes of face-to-face time during this encounter.   Lyndal Pulley, DO  :    CC: Back pain with radicular symptoms follow-up  SFK:[id number]  [name] is a 63 y.o. female coming in with complaint of worsening back pain.  States that it is severe.  Patient did have an MRI showing nerve impingement that was consistent with the radicular symptoms of the L5-S1.  He ordered an L4-L5 epidural but unfortunately has not had it done yet.  Has not been able to start physical therapy.  Unable to do anything other than stated.  States laying down or standing seems pain.  Patient denies any weakness of the lower extremities.  States that the pain is unrelenting and worsening over     No past medical history on file. No past surgical history on file. Social History   Socioeconomic History  . Marital status: Unknown    Spouse name: Not on file  . Number of children: Not on file  . Years of  education: Not on file  . Highest education level: Not on file  Occupational History  . Not on file  Social Needs  . Financial resource strain: Not on file  . Food insecurity:    Worry: Not on file    Inability: Not on file  . Transportation needs:    Medical: Not on file    Non-medical: Not on file  Tobacco Use  . Smoking status: Never Smoker  . Smokeless tobacco: Former Network engineer and Sexual Activity  . Alcohol use: Never    Frequency: Never  . Drug use: Never  . Sexual activity: Not on file  Lifestyle  . Physical activity:    Days per week: Not on file    Minutes per session: Not on file  . Stress: Not on file  Relationships  . Social connections:    Talks on phone: Not on file    Gets together: Not on file    Attends religious service: Not on file    Active member of club or organization: Not on file    Attends meetings of clubs or organizations: Not on file    Relationship status: Not on file  Other Topics Concern  . Not on file  Social History Narrative  . Not on file   Allergies  Allergen Reactions  . Penicillins Anaphylaxis   Family History  Problem Relation Age of Onset  . Breast cancer Sister     Current Outpatient Medications (Endocrine & Metabolic):  .  TESTOSTERONE COMPOUNDING KIT TD, Place 0.2 mg onto the skin. Troche as directed .  thyroid (ARMOUR) 60 MG tablet, Take 1 tablet by mouth daily.    Current Outpatient Medications (Analgesics):  .  allopurinol (ZYLOPRIM) 300 MG tablet, Take 1 tablet (300 mg total) by mouth daily.   Current Outpatient Medications (Other):  .  amitriptyline (ELAVIL) 10 MG tablet, Take 1 tablet (10 mg total) by mouth at bedtime. Marland Kitchen  ESTRADIOL VA, Place 0.14 mg vaginally. Vaginal troche as directed .  gabapentin (NEURONTIN) 100 MG capsule, Take 2 capsules (200 mg total) by mouth 3 (three) times daily. .  progesterone 200 MG SUPP, Place 200 mg vaginally at bedtime. .  Vitamin D, Ergocalciferol, (DRISDOL) 1.25 MG  (50000 UT) CAPS capsule, TAKE 1 CAPSULE BY MOUTH EVERY 7 DAYS    Past medical history, social, surgical and family history all reviewed in electronic medical record.  No pertanent information unless stated regarding to the chief complaint.   Review of Systems:  No headache, visual changes, nausea, vomiting, diarrhea, constipation, dizziness, abdominal pain, skin rash, fevers, chills, night sweats, weight loss, swollen lymph nodes, body aches, joint swelling, , chest pain, shortness of breath, mood changes.  Positive muscle aches  Objective    General: No apparent distress alert and oriented x3 mood and affect normal, dressed appropriately.  HEENT: Pupils equal, extraocular movements intact      Impression and Recommendations:     This case required medical decision making of moderate complexity. The above documentation has been reviewed and is accurate and complete Lyndal Pulley, DO       Note: This dictation was prepared with Dragon dictation along with smaller phrase technology. Any transcriptional errors that result from this process are unintentional.

## 2019-04-11 ENCOUNTER — Encounter: Payer: Self-pay | Admitting: Family Medicine

## 2019-04-11 ENCOUNTER — Ambulatory Visit (INDEPENDENT_AMBULATORY_CARE_PROVIDER_SITE_OTHER): Payer: BLUE CROSS/BLUE SHIELD | Admitting: Family Medicine

## 2019-04-11 DIAGNOSIS — M5416 Radiculopathy, lumbar region: Secondary | ICD-10-CM

## 2019-04-11 NOTE — Assessment & Plan Note (Signed)
Lumbar radiculopathy.  Worsening symptoms.  I do believe that patient could be a candidate for the injection that has been ordered previously.  Encouraged her to call.  She is scheduled at the beginning of June for this.  Encourage patient to increase the gabapentin to 300 mg.  Patient will do a trial of this.  Discussed icing regimen and home exercises.  Discussed which activities of doing which wants to avoid.  Follow-up again after the injection to discuss further.  Worsening symptoms she is to call or go to the emergency department

## 2019-04-17 ENCOUNTER — Other Ambulatory Visit: Payer: Self-pay

## 2019-04-17 ENCOUNTER — Ambulatory Visit: Payer: BC Managed Care – PPO | Attending: Family Medicine | Admitting: Physical Therapy

## 2019-04-17 ENCOUNTER — Encounter: Payer: Self-pay | Admitting: Physical Therapy

## 2019-04-17 DIAGNOSIS — R262 Difficulty in walking, not elsewhere classified: Secondary | ICD-10-CM

## 2019-04-17 DIAGNOSIS — M62830 Muscle spasm of back: Secondary | ICD-10-CM | POA: Diagnosis not present

## 2019-04-17 DIAGNOSIS — M5442 Lumbago with sciatica, left side: Secondary | ICD-10-CM
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
## 2019-04-21 ENCOUNTER — Other Ambulatory Visit: Payer: Self-pay | Admitting: Family Medicine

## 2019-04-25 ENCOUNTER — Encounter: Payer: Self-pay | Admitting: Family

## 2019-04-25 ENCOUNTER — Encounter: Payer: Self-pay | Admitting: Physical Therapy

## 2019-04-25 ENCOUNTER — Other Ambulatory Visit: Payer: Self-pay

## 2019-04-25 ENCOUNTER — Ambulatory Visit: Payer: BC Managed Care – PPO | Admitting: Physical Therapy

## 2019-04-25 DIAGNOSIS — M5442 Lumbago with sciatica, left side: Secondary | ICD-10-CM | POA: Diagnosis not present

## 2019-04-25 DIAGNOSIS — R262 Difficulty in walking, not elsewhere classified: Secondary | ICD-10-CM | POA: Diagnosis not present

## 2019-04-25 DIAGNOSIS — M62830 Muscle spasm of back: Secondary | ICD-10-CM | POA: Diagnosis not present

## 2019-04-25 NOTE — Therapy (Signed)
[place], [place], [address] Phone: [phone]   Fax:  [phone]  Physical Therapy Treatment  Patient Details  Name: [name] MRN: [id number] Date of Birth: 08/05/1956 Referring Provider (PT): Creig Hines   Encounter Date: 04/25/2019  PT End of Session - 04/25/19 1013    Visit Number  2    Date for PT Re-Evaluation  06/17/19    PT Start Time  0929    PT Stop Time  1011    PT Time Calculation (min)  42 min    Activity Tolerance  Patient tolerated treatment well    Behavior During Therapy  [facility] for tasks assessed/performed       History reviewed. No pertinent past medical history.  History reviewed. No pertinent surgical history.  There were no vitals filed for this visit.  Subjective Assessment - 04/25/19 0933    Subjective  Patient reports that she is ready to start exercising, she talks about her knee hurting a little today.    Currently in Pain?  Yes    Pain Score  2     Pain Location  Hip    Pain Orientation  Left                       [facility] Adult PT Treatment/Exercise - 04/25/19 0001      Exercises   Exercises  Lumbar      Lumbar Exercises: Stretches   Passive Hamstring Stretch  Right;Left;4 reps;20 seconds    ITB Stretch  Right;Left;3 reps;20 seconds    Piriformis Stretch  Right;Left;4 reps;20 seconds      Lumbar Exercises: Aerobic   Nustep  level 5 x 5 minutes      Lumbar Exercises: Machines for Strengthening   Cybex Knee Extension  5# 2x10    Cybex Knee Flexion  25# 2x10    Leg Press  20# 2x10, left leg only x10    Other Lumbar Machine Exercise  seated row 20# 2x10, lats 20# 2x10    Other Lumbar Machine Exercise  standing 5# rows and extension with arms      Lumbar Exercises: Standing   Other Standing Lumbar Exercises  hip extension and abduction with yellow tband               PT Short Term Goals - 04/25/19 1014      PT SHORT  TERM GOAL #1   Title  independent with initial HEP    Status  Partially Met        PT Long Term Goals - 04/17/19 1547      PT LONG TERM GOAL #1   Title  understand posture and body mechanics    Time  8    Period  Weeks    Status  New      PT LONG TERM GOAL #2   Title  decrease pain 50%    Time  8    Period  Weeks    Status  New      PT LONG TERM GOAL #3   Title  increase left hip strength to 4/5    Time  8    Period  Weeks    Status  New      PT LONG TERM GOAL #4   Title  return safely to a gym    Time  8    Period  Weeks    Status  New            Plan - 04/25/19 1013    Clinical Impression Statement  PT gave a lot of cues and education for posture and form during exercises, she is very tight in the LE's and the left leg is very weak    PT Next Visit Plan  see if she was sore or had increased pain    Consulted and Agree with Plan of Care  Patient       Patient will benefit from skilled therapeutic intervention in order to improve the following deficits and impairments:  Abnormal gait, Decreased mobility, Difficulty walking, Increased muscle spasms, Improper body mechanics, Decreased range of motion, Decreased activity tolerance, Decreased strength, Impaired flexibility, Postural dysfunction, Pain  Visit Diagnosis: Acute left-sided low back pain with left-sided sciatica  Muscle spasm of back  Difficulty in walking, not elsewhere classified     Problem List Patient Active Problem List   Diagnosis Date Noted  . Left lumbar radiculopathy 01/03/2019  . Gout 08/30/2018  . Fibromyalgia 08/30/2018  . Hypothyroidism 08/30/2018  . Herpes infection 06/09/2017    Sumner Boast., PT 04/25/2019, 10:15 AM  [address] [place], [place], [address] Phone: [phone]   Fax:  [phone]  Name: [name] MRN: [id number] Date of Birth: [birth year]

## 2019-04-26 ENCOUNTER — Ambulatory Visit
Admission: RE | Admit: 2019-04-26 | Discharge: 2019-04-26 | Disposition: A | Discharge status: Home / Self Care | Payer: BLUE CROSS/BLUE SHIELD | Source: Ambulatory Visit | Attending: Family | Admitting: Family

## 2019-04-26 DIAGNOSIS — N926 Irregular menstruation, unspecified: Secondary | ICD-10-CM | POA: Diagnosis not present

## 2019-05-02 ENCOUNTER — Ambulatory Visit: Payer: BC Managed Care – PPO | Attending: Family Medicine | Admitting: Physical Therapy

## 2019-05-02 ENCOUNTER — Encounter: Payer: Self-pay | Admitting: Physical Therapy

## 2019-05-02 ENCOUNTER — Other Ambulatory Visit: Payer: Self-pay

## 2019-05-02 DIAGNOSIS — M5442 Lumbago with sciatica, left side: Secondary | ICD-10-CM | POA: Diagnosis not present

## 2019-05-02 DIAGNOSIS — M62830 Muscle spasm of back: Secondary | ICD-10-CM | POA: Insufficient documentation

## 2019-05-02 DIAGNOSIS — R262 Difficulty in walking, not elsewhere classified: Secondary | ICD-10-CM | POA: Insufficient documentation

## 2019-05-02 NOTE — Therapy (Signed)
[place], [place], [address] Phone: [phone]   Fax:  [phone]  Physical Therapy Treatment  Patient Details  Name: Aoife Bold MRN: [id number] Date of Birth: 03-16-1956 Referring Provider (PT): Creig Hines   Encounter Date: 05/02/2019  PT End of Session - 05/02/19 1140    Visit Number  3    Date for PT Re-Evaluation  06/17/19    PT Start Time  0930    PT Stop Time  1018    PT Time Calculation (min)  48 min    Activity Tolerance  Patient tolerated treatment well    Behavior During Therapy  [facility] for tasks assessed/performed       History reviewed. No pertinent past medical history.  History reviewed. No pertinent surgical history.  There were no vitals filed for this visit.  Subjective Assessment - 05/02/19 0939    Subjective  Patient reports a little soreness in teh mms, but overall doing well    Currently in Pain?  Yes    Pain Score  2     Pain Location  Hip    Pain Orientation  Left    Aggravating Factors   standing                       [facility] Adult PT Treatment/Exercise - 05/02/19 0001      High Level Balance   High Level Balance Comments  on airex ball tosses, eyes closed and with narrow BOS and head turns      Lumbar Exercises: Stretches   Passive Hamstring Stretch  Right;Left;4 reps;20 seconds    ITB Stretch  Right;Left;3 reps;20 seconds    Piriformis Stretch  Right;Left;4 reps;20 seconds      Lumbar Exercises: Aerobic   Nustep  level 5 x 5 minutes      Lumbar Exercises: Machines for Strengthening   Cybex Knee Extension  5# 2x10    Cybex Knee Flexion  25# 2x10    Leg Press  20# 2x10, left leg only x10    Other Lumbar Machine Exercise  seated row 25# 2x10, lats 25# 2x10    Other Lumbar Machine Exercise  standing 5# rows and extension with arms staggered stance, 15# AR press               PT Short Term Goals - 05/02/19 1141      PT SHORT  TERM GOAL #1   Title  independent with initial HEP    Status  Achieved        PT Long Term Goals - 05/02/19 1141      PT LONG TERM GOAL #1   Title  understand posture and body mechanics    Status  On-going      PT LONG TERM GOAL #2   Title  decrease pain 50%    Status  Partially Met      PT LONG TERM GOAL #3   Title  increase left hip strength to 4/5    Status  On-going            Plan - 05/02/19 1140    Clinical Impression Statement  Patient needing to work on core and balance, she did well except the balance was an issue, she needed verbal and tactile cues to engage the core    PT Next Visit Plan  continue to progress as tolerated    Consulted and Agree with Plan  of Care  Patient       Patient will benefit from skilled therapeutic intervention in order to improve the following deficits and impairments:  Abnormal gait, Decreased mobility, Difficulty walking, Increased muscle spasms, Improper body mechanics, Decreased range of motion, Decreased activity tolerance, Decreased strength, Impaired flexibility, Postural dysfunction, Pain  Visit Diagnosis: Acute left-sided low back pain with left-sided sciatica  Muscle spasm of back  Difficulty in walking, not elsewhere classified     Problem List Patient Active Problem List   Diagnosis Date Noted  . Left lumbar radiculopathy 01/03/2019  . Gout 08/30/2018  . Fibromyalgia 08/30/2018  . Hypothyroidism 08/30/2018  . Herpes infection 06/09/2017    Sumner Boast., PT 05/02/2019, 11:42 AM  [address] [place], [place], [address] Phone: [phone]   Fax:  [phone]  Name: Brihany Butch MRN: [id number] Date of Birth: July 15, 1956

## 2019-05-05 ENCOUNTER — Encounter: Payer: Self-pay | Admitting: Family

## 2019-05-10 ENCOUNTER — Ambulatory Visit: Payer: BC Managed Care – PPO | Admitting: Physical Therapy

## 2019-05-10 ENCOUNTER — Encounter: Payer: Self-pay | Admitting: Physical Therapy

## 2019-05-10 ENCOUNTER — Other Ambulatory Visit: Payer: Self-pay

## 2019-05-10 DIAGNOSIS — M47892 Other spondylosis, cervical region: Secondary | ICD-10-CM | POA: Diagnosis not present

## 2019-05-10 DIAGNOSIS — M62830 Muscle spasm of back: Secondary | ICD-10-CM

## 2019-05-10 DIAGNOSIS — M9903 Segmental and somatic dysfunction of lumbar region: Secondary | ICD-10-CM | POA: Diagnosis not present

## 2019-05-10 DIAGNOSIS — M9901 Segmental and somatic dysfunction of cervical region: Secondary | ICD-10-CM | POA: Diagnosis not present

## 2019-05-10 DIAGNOSIS — M5442 Lumbago with sciatica, left side: Secondary | ICD-10-CM

## 2019-05-10 DIAGNOSIS — M9902 Segmental and somatic dysfunction of thoracic region: Secondary | ICD-10-CM | POA: Diagnosis not present

## 2019-05-10 DIAGNOSIS — R262 Difficulty in walking, not elsewhere classified: Secondary | ICD-10-CM | POA: Diagnosis not present

## 2019-05-10 NOTE — Therapy (Signed)
[place], [place], [address] Phone: [phone]   Fax:  [phone]  Physical Therapy Treatment  Patient Details  Name: [name] MRN: [id number] Date of Birth: 12-24-1955 Referring Provider (PT): Creig Hines   Encounter Date: 05/10/2019  PT End of Session - 05/10/19 1539    Visit Number  4    Date for PT Re-Evaluation  06/17/19    PT Start Time  1315    PT Stop Time  1400    PT Time Calculation (min)  45 min    Activity Tolerance  Patient tolerated treatment well    Behavior During Therapy  [facility] for tasks assessed/performed       History reviewed. No pertinent past medical history.  History reviewed. No pertinent surgical history.  There were no vitals filed for this visit.  Subjective Assessment - 05/10/19 1323    Subjective  Patient reports that she went to the beach last week and really has done well.  Less overall pain    Currently in Pain?  Yes    Pain Score  1     Pain Location  Hip    Pain Orientation  Left                       [facility] Adult PT Treatment/Exercise - 05/10/19 0001      High Level Balance   High Level Balance Activities  Tandem walking    High Level Balance Comments  on airex ball tosses, eyes closed and with narrow BOS and head turns, on Bosu head turns and just trying to balance      Lumbar Exercises: Stretches   Passive Hamstring Stretch  Right;Left;4 reps;20 seconds    Piriformis Stretch  Right;Left;4 reps;20 seconds      Lumbar Exercises: Aerobic   Nustep  level 5 x 5 minutes      Lumbar Exercises: Machines for Strengthening   Cybex Knee Extension  10# 2x15    Cybex Knee Flexion  35# 2x15    Other Lumbar Machine Exercise  seated row 25# 2x10, lats 25# 2x10    Other Lumbar Machine Exercise  standing 5# rows and extension with arms staggered stance, 15# AR press, 5# hip extension and abduction      Lumbar Exercises: Supine   Bridge  with Ball Squeeze  15 reps;1 second    Bridge with clamshell  15 reps;1 second      Lumbar Exercises: Prone   Other Prone Lumbar Exercises  modified plank and plank 2 x 10 seconds each               PT Short Term Goals - 05/02/19 1141      PT SHORT TERM GOAL #1   Title  independent with initial HEP    Status  Achieved        PT Long Term Goals - 05/10/19 1540      PT LONG TERM GOAL #1   Title  understand posture and body mechanics    Status  On-going      PT LONG TERM GOAL #2   Title  decrease pain 50%    Status  Partially Met      PT LONG TERM GOAL #3   Title  increase left hip strength to 4/5    Status  On-going      PT LONG TERM GOAL #4   Title  return safely  to a gym    Status  On-going            Plan - 05/10/19 1539    Clinical Impression Statement  Patient still with issues for the balance especially on bosu or tandem on airex.  We started trying some more advanced core work but have been very cautious due to past issues iwth any extension    PT Next Visit Plan  continue to progress as tolerated    Consulted and Agree with Plan of Care  Patient       Patient will benefit from skilled therapeutic intervention in order to improve the following deficits and impairments:  Abnormal gait, Decreased mobility, Difficulty walking, Increased muscle spasms, Improper body mechanics, Decreased range of motion, Decreased activity tolerance, Decreased strength, Impaired flexibility, Postural dysfunction, Pain  Visit Diagnosis: Acute left-sided low back pain with left-sided sciatica  Muscle spasm of back  Difficulty in walking, not elsewhere classified     Problem List Patient Active Problem List   Diagnosis Date Noted  . Left lumbar radiculopathy 01/03/2019  . Gout 08/30/2018  . Fibromyalgia 08/30/2018  . Hypothyroidism 08/30/2018  . Herpes infection 06/09/2017    Sumner Boast., PT 05/10/2019, 3:41 PM  [place] [address] [place], [place], [address] Phone: [phone]   Fax:  [phone]  Name: [name] MRN: [id number] Date of Birth: August 26, 1956

## 2019-05-11 DIAGNOSIS — Z6828 Body mass index (BMI) 28.0-28.9, adult: Secondary | ICD-10-CM | POA: Diagnosis not present

## 2019-05-11 DIAGNOSIS — Z803 Family history of malignant neoplasm of breast: Secondary | ICD-10-CM | POA: Diagnosis not present

## 2019-05-11 DIAGNOSIS — N950 Postmenopausal bleeding: Secondary | ICD-10-CM | POA: Diagnosis not present

## 2019-05-11 DIAGNOSIS — N711 Chronic inflammatory disease of uterus: Secondary | ICD-10-CM | POA: Diagnosis not present

## 2019-05-11 DIAGNOSIS — Z8049 Family history of malignant neoplasm of other genital organs: Secondary | ICD-10-CM | POA: Diagnosis not present

## 2019-05-11 DIAGNOSIS — Z800 Family history of malignant neoplasm of digestive organs: Secondary | ICD-10-CM | POA: Diagnosis not present

## 2019-05-11 DIAGNOSIS — N859 Noninflammatory disorder of uterus, unspecified: Secondary | ICD-10-CM | POA: Diagnosis not present

## 2019-05-12 ENCOUNTER — Other Ambulatory Visit: Payer: Self-pay

## 2019-05-12 ENCOUNTER — Ambulatory Visit
Admission: RE | Admit: 2019-05-12 | Discharge: 2019-05-12 | Disposition: A | Discharge status: Home / Self Care | Payer: BC Managed Care – PPO | Source: Ambulatory Visit | Attending: Family Medicine | Admitting: Family Medicine

## 2019-05-12 DIAGNOSIS — G8929 Other chronic pain: Secondary | ICD-10-CM

## 2019-05-12 DIAGNOSIS — M4727 Other spondylosis with radiculopathy, lumbosacral region: Secondary | ICD-10-CM | POA: Diagnosis not present

## 2019-05-12 DIAGNOSIS — M545 Low back pain, unspecified: Secondary | ICD-10-CM

## 2019-05-12 MED ORDER — IOPAMIDOL (ISOVUE-M 200) INJECTION 41%
1.0000 mL | Freq: Once | INTRAMUSCULAR | Status: AC
  Administered 2019-05-12: 1 mL via EPIDURAL

## 2019-05-12 MED ORDER — METHYLPREDNISOLONE ACETATE 40 MG/ML INJ SUSP (RADIOLOG
120.0000 mg | Freq: Once | INTRAMUSCULAR | Status: AC
  Administered 2019-05-12: 120 mg via EPIDURAL

## 2019-05-12 NOTE — Discharge Instructions (Signed)

## 2019-05-16 ENCOUNTER — Ambulatory Visit: Payer: BC Managed Care – PPO

## 2019-05-17 DIAGNOSIS — Z1283 Encounter for screening for malignant neoplasm of skin: Secondary | ICD-10-CM | POA: Diagnosis not present

## 2019-05-17 DIAGNOSIS — C44622 Squamous cell carcinoma of skin of right upper limb, including shoulder: Secondary | ICD-10-CM | POA: Diagnosis not present

## 2019-05-17 DIAGNOSIS — D2111 Benign neoplasm of connective and other soft tissue of right upper limb, including shoulder: Secondary | ICD-10-CM | POA: Diagnosis not present

## 2019-05-23 ENCOUNTER — Other Ambulatory Visit: Payer: Self-pay

## 2019-05-23 ENCOUNTER — Encounter: Payer: Self-pay | Admitting: Family Medicine

## 2019-05-23 ENCOUNTER — Ambulatory Visit: Payer: BC Managed Care – PPO | Admitting: Physical Therapy

## 2019-05-23 ENCOUNTER — Encounter: Payer: Self-pay | Admitting: Physical Therapy

## 2019-05-23 DIAGNOSIS — M5442 Lumbago with sciatica, left side: Secondary | ICD-10-CM

## 2019-05-23 DIAGNOSIS — M62830 Muscle spasm of back: Secondary | ICD-10-CM | POA: Diagnosis not present

## 2019-05-23 DIAGNOSIS — R262 Difficulty in walking, not elsewhere classified: Secondary | ICD-10-CM | POA: Diagnosis not present

## 2019-05-23 NOTE — Therapy (Signed)
[place], [place], [address] Phone: [phone]   Fax:  [phone]  Physical Therapy Treatment  Patient Details  Name: [name] MRN: [id number] Date of Birth: 1956/06/01 Referring Provider (PT): Creig Hines   Encounter Date: 05/23/2019  PT End of Session - 05/23/19 1717    Visit Number  5    Number of Visits  28    Date for PT Re-Evaluation  06/17/19    PT Start Time  1400    PT Stop Time  1500    PT Time Calculation (min)  60 min    Activity Tolerance  Patient tolerated treatment well    Behavior During Therapy  [facility] for tasks assessed/performed       History reviewed. No pertinent past medical history.  History reviewed. No pertinent surgical history.  There were no vitals filed for this visit.  Subjective Assessment - 05/23/19 1405    Subjective  Patient reports that she was doing very well, but about 10 days ago she had a steroid injection in the left L4-5 area, she reports that about 72 hours later she was having a "flashing pain" down her left leg, reports that it is still there    Currently in Pain?  Yes    Pain Score  5     Pain Location  Back    Pain Orientation  Lower    Aggravating Factors   the injection                       [facility] Adult PT Treatment/Exercise - 05/23/19 0001      Lumbar Exercises: Stretches   Passive Hamstring Stretch  Right;Left;4 reps;20 seconds    Piriformis Stretch  Right;Left;4 reps;20 seconds      Lumbar Exercises: Aerobic   Nustep  level 46x 5 minutes      Lumbar Exercises: Machines for Strengthening   Cybex Knee Extension  10# 2x15    Cybex Knee Flexion  35# 2x15    Other Lumbar Machine Exercise  seated row 25# 2x10, lats 25# 2x10      Lumbar Exercises: Supine   Other Supine Lumbar Exercises  feet on ball K2C, trunk rotaiton, small bridgea nd isometric abs      Modalities   Modalities  Ultrasound;Electrical  Stimulation;Moist Heat      Moist Heat Therapy   Number Minutes Moist Heat  15 Minutes    Moist Heat Location  Hip      Electrical Stimulation   Electrical Stimulation Location  left SI area    Electrical Stimulation Action  IFC    Electrical Stimulation Parameters  right sidelying    Electrical Stimulation Goals  Pain      Ultrasound   Ultrasound Location  left SI area    Ultrasound Parameters  100% 1.4w/cm2    Ultrasound Goals  Pain      Manual Therapy   Manual Therapy  Soft tissue mobilization    Soft tissue mobilization  left SI and buttock area               PT Short Term Goals - 05/02/19 1141      PT SHORT TERM GOAL #1   Title  independent with initial HEP    Status  Achieved        PT Long Term Goals - 05/10/19 1540      PT LONG TERM GOAL #1  Title  understand posture and body mechanics    Status  On-going      PT LONG TERM GOAL #2   Title  decrease pain 50%    Status  Partially Met      PT LONG TERM GOAL #3   Title  increase left hip strength to 4/5    Status  On-going      PT LONG TERM GOAL #4   Title  return safely to a gym    Status  On-going            Plan - 05/23/19 1718    Clinical Impression Statement  Patient reports that she has had increased pain in the left SI area since the injection last week, she is frustrated today.  She reports that she is very tender in this area.  I backed off of the exercises and tried some modalities to see if we could decrease the pain    PT Next Visit Plan  see how her tenderness is    Consulted and Agree with Plan of Care  Patient       Patient will benefit from skilled therapeutic intervention in order to improve the following deficits and impairments:  Abnormal gait, Decreased mobility, Difficulty walking, Increased muscle spasms, Improper body mechanics, Decreased range of motion, Decreased activity tolerance, Decreased strength, Impaired flexibility, Postural dysfunction, Pain  Visit  Diagnosis: 1. Acute left-sided low back pain with left-sided sciatica   2. Muscle spasm of back   3. Difficulty in walking, not elsewhere classified        Problem List Patient Active Problem List   Diagnosis Date Noted  . Left lumbar radiculopathy 01/03/2019  . Gout 08/30/2018  . Fibromyalgia 08/30/2018  . Hypothyroidism 08/30/2018  . Herpes infection 06/09/2017    ALBRIGHT,MICHAEL W., PT 05/23/2019, 5:21 PM  [facility] [address] [place], [place], [address] Phone: [phone]   Fax:  [phone]  Name: [name] MRN: [id number] Date of Birth: 03/05/1956   

## 2019-05-24 DIAGNOSIS — M9903 Segmental and somatic dysfunction of lumbar region: Secondary | ICD-10-CM | POA: Diagnosis not present

## 2019-05-24 DIAGNOSIS — M9902 Segmental and somatic dysfunction of thoracic region: Secondary | ICD-10-CM | POA: Diagnosis not present

## 2019-05-24 DIAGNOSIS — M47892 Other spondylosis, cervical region: Secondary | ICD-10-CM | POA: Diagnosis not present

## 2019-05-24 DIAGNOSIS — M9901 Segmental and somatic dysfunction of cervical region: Secondary | ICD-10-CM | POA: Diagnosis not present

## 2019-05-25 ENCOUNTER — Ambulatory Visit (INDEPENDENT_AMBULATORY_CARE_PROVIDER_SITE_OTHER): Payer: BC Managed Care – PPO

## 2019-05-25 ENCOUNTER — Other Ambulatory Visit (INDEPENDENT_AMBULATORY_CARE_PROVIDER_SITE_OTHER): Payer: BC Managed Care – PPO

## 2019-05-25 DIAGNOSIS — E559 Vitamin D deficiency, unspecified: Secondary | ICD-10-CM

## 2019-05-25 DIAGNOSIS — Z299 Encounter for prophylactic measures, unspecified: Secondary | ICD-10-CM

## 2019-05-25 DIAGNOSIS — N926 Irregular menstruation, unspecified: Secondary | ICD-10-CM

## 2019-05-25 DIAGNOSIS — Z23 Encounter for immunization: Secondary | ICD-10-CM

## 2019-05-25 DIAGNOSIS — E039 Hypothyroidism, unspecified: Secondary | ICD-10-CM | POA: Diagnosis not present

## 2019-05-25 DIAGNOSIS — Z13220 Encounter for screening for lipoid disorders: Secondary | ICD-10-CM

## 2019-05-25 DIAGNOSIS — M109 Gout, unspecified: Secondary | ICD-10-CM

## 2019-05-25 LAB — CBC WITH DIFFERENTIAL/PLATELET
Basophils Absolute: 0.1 10*3/uL (ref 0.0–0.1)
Basophils Relative: 0.6 % (ref 0.0–3.0)
Eosinophils Absolute: 0.2 10*3/uL (ref 0.0–0.7)
Eosinophils Relative: 2.6 % (ref 0.0–5.0)
HCT: 39.2 % (ref 36.0–46.0)
Hemoglobin: 13.4 g/dL (ref 12.0–15.0)
Lymphocytes Relative: 36.4 % (ref 12.0–46.0)
Lymphs Abs: 3.4 10*3/uL (ref 0.7–4.0)
MCHC: 34.2 g/dL (ref 30.0–36.0)
MCV: 91 fl (ref 78.0–100.0)
Monocytes Absolute: 0.6 10*3/uL (ref 0.1–1.0)
Monocytes Relative: 6 % (ref 3.0–12.0)
Neutro Abs: 5.1 10*3/uL (ref 1.4–7.7)
Neutrophils Relative %: 54.4 % (ref 43.0–77.0)
Platelets: 277 10*3/uL (ref 150.0–400.0)
RBC: 4.31 Mil/uL (ref 3.87–5.11)
RDW: 13.2 % (ref 11.5–15.5)
WBC: 9.3 10*3/uL (ref 4.0–10.5)

## 2019-05-25 LAB — LIPID PANEL
Cholesterol: 229 mg/dL — ABNORMAL HIGH (ref 0–200)
HDL: 45.6 mg/dL (ref >39.00)
LDL Cholesterol: 146 mg/dL — ABNORMAL HIGH (ref 0–99)
NonHDL: 183.28
Total CHOL/HDL Ratio: 5
Triglycerides: 186 mg/dL — ABNORMAL HIGH (ref 0.0–149.0)
VLDL: 37.2 mg/dL (ref 0.0–40.0)

## 2019-05-25 LAB — URIC ACID: Uric Acid, Serum: 4.2 mg/dL (ref 2.4–7.0)

## 2019-05-25 LAB — COMPREHENSIVE METABOLIC PANEL
ALT: 8 U/L (ref 0–35)
AST: 9 U/L (ref 0–37)
Albumin: 4 g/dL (ref 3.5–5.2)
Alkaline Phosphatase: 38 U/L — ABNORMAL LOW (ref 39–117)
BUN: 15 mg/dL (ref 6–23)
CO2: 22 mEq/L (ref 19–32)
Calcium: 8.8 mg/dL (ref 8.4–10.5)
Chloride: 107 mEq/L (ref 96–112)
Creatinine, Ser: 0.87 mg/dL (ref 0.40–1.20)
GFR: 66.18 mL/min (ref >60.00)
Glucose, Bld: 104 mg/dL — ABNORMAL HIGH (ref 70–99)
Potassium: 3.8 mEq/L (ref 3.5–5.1)
Sodium: 138 mEq/L (ref 135–145)
Total Bilirubin: 0.5 mg/dL (ref 0.2–1.2)
Total Protein: 6.5 g/dL (ref 6.0–8.3)

## 2019-05-25 LAB — TSH: TSH: 0.2 u[IU]/mL — ABNORMAL LOW (ref 0.35–4.50)

## 2019-05-25 LAB — VITAMIN D 25 HYDROXY (VIT D DEFICIENCY, FRACTURES): VITD: 45.26 ng/mL (ref 30.00–100.00)

## 2019-05-26 LAB — IRON,TIBC AND FERRITIN PANEL
%SAT: 30 % (calc) (ref 16–45)
Ferritin: 54 ng/mL (ref 16–288)
Iron: 88 ug/dL (ref 45–160)
TIBC: 297 mcg/dL (calc) (ref 250–450)

## 2019-05-29 ENCOUNTER — Other Ambulatory Visit: Payer: Self-pay | Admitting: Family

## 2019-05-29 DIAGNOSIS — N950 Postmenopausal bleeding: Secondary | ICD-10-CM | POA: Diagnosis not present

## 2019-05-29 DIAGNOSIS — E039 Hypothyroidism, unspecified: Secondary | ICD-10-CM

## 2019-05-29 DIAGNOSIS — E785 Hyperlipidemia, unspecified: Secondary | ICD-10-CM

## 2019-05-29 DIAGNOSIS — Z6827 Body mass index (BMI) 27.0-27.9, adult: Secondary | ICD-10-CM | POA: Diagnosis not present

## 2019-05-29 MED ORDER — PRAVASTATIN SODIUM 40 MG PO TABS: 40.0000 mg | ORAL_TABLET | Freq: Every day | ORAL | 0 refills | Status: DC

## 2019-06-04 NOTE — Progress Notes (Signed)
Virtual Visit via Video Note  I connected with Iver Nestle on 06/05/19 at  3:00 PM EDT by a video enabled telemedicine application and verified that I am speaking with the correct person using two identifiers.  Location: Patient: in home setting  Provider:  In office setting    I discussed the limitations of evaluation and management by telemedicine and the availability of in person appointments. The patient expressed understanding and agreed to proceed.  History of Present Illness: 63 year old female with back pain.  Was sent for a second epidural.  States that unfortunately having now more increased radicular symptoms down the leg.  Patient states she is unable to do take talk dances with [employer] grandchild patient does have an MRI showing that there is a does have facet arthropathy at L4-L5 as well as a mild disc degeneration with an L5 nerve root impingement may be on the left side.    Observations/Objective: Alert and oriented x3.  Patient minorly tearful when speaking.   Assessment and Plan: Lumbar radiculopathy, L5 nerve root impingement that is consistent with MRI findings.  Has not responded extremely well to 2 separate injections.  Encourage patient to consider the possibility to discuss other treatment options with neurosurgery.  Has gone to formal physical therapy greater than 6 weeks and does not want to repeat it secondary to cost.  We discussed with patient otherwise we are talking about sacroiliac injections for diagnostic purposes or the possibility of other type of injections that we can do in office or medications which patient also declined.   Follow Up Instructions: Patient will follow-up with neurosurgery and then discussed with me other treatment options she would like to consider    I discussed the assessment and treatment plan with the patient. The patient was provided an opportunity to ask questions and all were answered. The patient agreed with the plan and  demonstrated an understanding of the instructions.   The patient was advised to call back or seek an in-person evaluation if the symptoms worsen or if the condition fails to improve as anticipated.  I provided 29 minutes of non-face-to-face time during this encounter.   Lyndal Pulley, DO

## 2019-06-05 ENCOUNTER — Ambulatory Visit (INDEPENDENT_AMBULATORY_CARE_PROVIDER_SITE_OTHER): Payer: BC Managed Care – PPO | Admitting: Family Medicine

## 2019-06-05 ENCOUNTER — Encounter: Payer: Self-pay | Admitting: Family Medicine

## 2019-06-05 ENCOUNTER — Other Ambulatory Visit: Payer: Self-pay

## 2019-06-05 DIAGNOSIS — M5416 Radiculopathy, lumbar region: Secondary | ICD-10-CM | POA: Diagnosis not present

## 2019-06-19 ENCOUNTER — Telehealth: Payer: Self-pay | Admitting: Family

## 2019-06-19 ENCOUNTER — Telehealth: Payer: BC Managed Care – PPO | Admitting: Physician Assistant

## 2019-06-19 ENCOUNTER — Encounter: Payer: Self-pay | Admitting: Physician Assistant

## 2019-06-19 ENCOUNTER — Encounter: Payer: Self-pay | Admitting: Family Medicine

## 2019-06-19 DIAGNOSIS — M545 Low back pain, unspecified: Secondary | ICD-10-CM

## 2019-06-19 DIAGNOSIS — G4489 Other headache syndrome: Secondary | ICD-10-CM

## 2019-06-19 DIAGNOSIS — M5416 Radiculopathy, lumbar region: Secondary | ICD-10-CM

## 2019-06-19 NOTE — Progress Notes (Signed)
Based on what you shared with me, I feel your condition warrants further evaluation and I recommend that you be seen for a face to face office visit.  NOTE: If you entered your credit card information for this eVisit, you will not be charged. You may see a "hold" on your card for the $35 but that hold will drop off and you will not have a charge processed.    You stated your main concern is the severe headache and thus I recommend having a face to face evaluation of your symptoms to exclude any emergent causes of your symptoms.   If you are having a true medical emergency please call 911.     For an urgent face to face visit, [place] has five urgent care centers for your convenience:    [email or web address] to reserve your spot online an avoid wait times  [facility] (New Address!) [address], [place], [place] [address] *Just off [address], across the road from [place] hours of operation: Monday-Friday, 12 PM to 6 PM  Closed Saturday & Sunday   The following sites will take your insurance:  . [facility]    [phone]                  Get Driving Directions  [address], [place] [address] . 10 am to 8 pm Monday-Friday . 12 pm to 8 pm Saturday-Sunday   . [facility] at [place]                  Get Driving Directions  [address], [place], [place] [address] . 8 am to 8 pm Monday-Friday . 9 am to 6 pm Saturday . 11 am to 6 pm Sunday   . [facility] at [place]                  Get Driving Directions   [address] [place], [place] [address] . 8 am to 8 pm Monday-Friday . 8 am to 4 pm Saturday-Sunday    . [facility] at [place]                    Get Driving Directions  [phone]  [address] [place], [place] [address]  .  Monday-Friday, 12 PM to 6 PM    Your e-visit answers were reviewed by a board certified advanced clinical practitioner to complete your personal care plan.  Thank you for using e-Visits. I spent 5-10 minutes on review and completion of this note- Lacy Duverney [facility]

## 2019-06-19 NOTE — Telephone Encounter (Signed)
I noticed she is scheduled next week due to concerns for side effects of her statin. Please ask her to go ahead and hold it for now and we can see if she feels better by next week. If it is the statin, the symptoms should improve.

## 2019-06-19 NOTE — Telephone Encounter (Signed)
Message read by patient today.

## 2019-06-19 NOTE — Telephone Encounter (Signed)
Sent patient mychart message

## 2019-06-22 ENCOUNTER — Other Ambulatory Visit: Payer: Self-pay | Admitting: Family Medicine

## 2019-06-26 ENCOUNTER — Other Ambulatory Visit: Payer: Self-pay

## 2019-06-26 ENCOUNTER — Ambulatory Visit (INDEPENDENT_AMBULATORY_CARE_PROVIDER_SITE_OTHER): Payer: BC Managed Care – PPO | Admitting: Family

## 2019-06-26 ENCOUNTER — Encounter: Payer: Self-pay | Admitting: Family

## 2019-06-26 VITALS — BP 106/76 | HR 73 | Temp 98.7°F | Ht 67.0 in | Wt 178.2 lb

## 2019-06-26 DIAGNOSIS — E785 Hyperlipidemia, unspecified: Secondary | ICD-10-CM | POA: Diagnosis not present

## 2019-06-26 DIAGNOSIS — C44622 Squamous cell carcinoma of skin of right upper limb, including shoulder: Secondary | ICD-10-CM

## 2019-06-26 DIAGNOSIS — B009 Herpesviral infection, unspecified: Secondary | ICD-10-CM

## 2019-06-26 MED ORDER — EZETIMIBE 10 MG PO TABS: 10.0000 mg | ORAL_TABLET | Freq: Every day | ORAL | 3 refills | Status: DC

## 2019-06-26 NOTE — Progress Notes (Signed)
[name] is a 63 y.o. female with the following history as recorded in EpicCare:  Patient Active Problem List   Diagnosis Date Noted  . Squamous cell cancer of skin of right hand 06/26/2019  . Left lumbar radiculopathy 01/03/2019  . Gout 08/30/2018  . Fibromyalgia 08/30/2018  . Hypothyroidism 08/30/2018  . Herpes infection 06/09/2017    Current Outpatient Medications  Medication Sig Dispense Refill  . allopurinol (ZYLOPRIM) 300 MG tablet Take 1 tablet (300 mg total) by mouth daily. 90 tablet 3  . amitriptyline (ELAVIL) 10 MG tablet Take 1 tablet (10 mg total) by mouth at bedtime. 90 tablet 3  . ESTRADIOL VA Place 0.14 mg vaginally. Vaginal troche as directed    . gabapentin (NEURONTIN) 100 MG capsule Take 2 capsules (200 mg total) by mouth 3 (three) times daily. 180 capsule 3  . progesterone 200 MG SUPP Place 200 mg vaginally at bedtime.    . TESTOSTERONE COMPOUNDING KIT TD Place 0.2 mg onto the skin. Troche as directed    . thyroid (ARMOUR) 60 MG tablet Take 1 tablet by mouth daily.    . Vitamin D, Ergocalciferol, (DRISDOL) 1.25 MG (50000 UT) CAPS capsule TAKE ONE CAPSULE BY MOUTH ONCE WEEKLY 8 capsule 0  . ezetimibe (ZETIA) 10 MG tablet Take 1 tablet (10 mg total) by mouth daily. 90 tablet 3   No current facility-administered medications for this visit.     Allergies: Penicillins and Statins  History reviewed. No pertinent past medical history.  History reviewed. No pertinent surgical history.  Family History  Problem Relation Age of Onset  . Breast cancer Sister     Social History   Tobacco Use  . Smoking status: Never Smoker  . Smokeless tobacco: Former Network engineer Use Topics  . Alcohol use: Never    Frequency: Never    Subjective:  1) Follow-up on side effects of statin- was having increased headaches on Pravachol; has been feeling better since stopping the medication; notes she has not been able to tolerate other statins in the past either; 2) "Rash" on  her bottom- has a very sore spot; no changes in bowel movements; not itching; denies any new soaps, foods, detergents or medications.  3) Will be seeing neurosurgeon about chronic back pain- admits that stress level has been very high recently; 4) Recently had squamous cell removed from right hand- will be going to [facility] for follow-up   Objective:  Vitals:   06/26/19 0920  BP: 106/76  Pulse: 73  Temp: 98.7 F (37.1 C)  TempSrc: Oral  SpO2: 97%  Weight: 178 lb 3.2 oz (80.8 kg)  Height: 5' 7" (1.702 m)    General: Well developed, well nourished, in no acute distress  Skin : Warm and dry. Localized herpes outbreak on right buttock Head: Normocephalic and atraumatic  Lungs: Respirations unlabored;  Neurologic: Alert and oriented; speech intact; face symmetrical; moves all extremities well; CNII-XII intact without focal deficit  Assessment:  1. Hyperlipidemia, unspecified hyperlipidemia type   2. Herpes infection   3. Squamous cell cancer of skin of right hand     Plan:  1. Appears to be statin intolerant; trial of Zetia 10 mg daily; plan to re-check labs in 3-4 months; 2. Re-start her Acyclovir; plan to use regularly for the next 6 months or so; 3. Scheduled to go to [facility] in follow-up.   No follow-ups on file.  No orders of the defined types were placed in this encounter.  Requested Prescriptions   Signed Prescriptions Disp Refills  . ezetimibe (ZETIA) 10 MG tablet 90 tablet 3    Sig: Take 1 tablet (10 mg total) by mouth daily.

## 2019-06-29 DIAGNOSIS — M4310 Spondylolisthesis, site unspecified: Secondary | ICD-10-CM | POA: Diagnosis not present

## 2019-06-30 DIAGNOSIS — M9901 Segmental and somatic dysfunction of cervical region: Secondary | ICD-10-CM | POA: Diagnosis not present

## 2019-06-30 DIAGNOSIS — M47892 Other spondylosis, cervical region: Secondary | ICD-10-CM | POA: Diagnosis not present

## 2019-06-30 DIAGNOSIS — M9902 Segmental and somatic dysfunction of thoracic region: Secondary | ICD-10-CM | POA: Diagnosis not present

## 2019-06-30 DIAGNOSIS — M9903 Segmental and somatic dysfunction of lumbar region: Secondary | ICD-10-CM | POA: Diagnosis not present

## 2019-07-13 IMAGING — DX DG LUMBAR SPINE COMPLETE 4+V
5 series · 5 of 5 positions shown · non-contrast
Comparison: None.

CLINICAL DATA: Lower back pain x 10 days, NKI, hx of lt side
sciatica.

EXAM:
LUMBAR SPINE - COMPLETE 4+ VIEW

[l-spine ap]
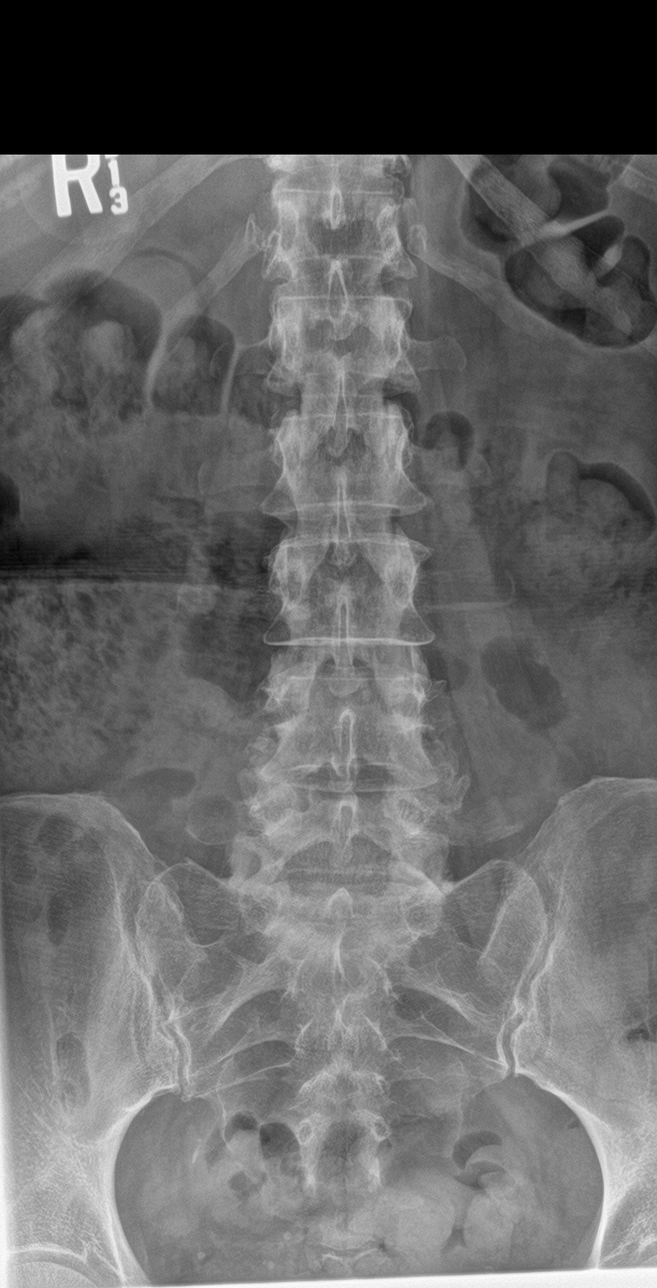

[l-spine obl (1 of 2)]
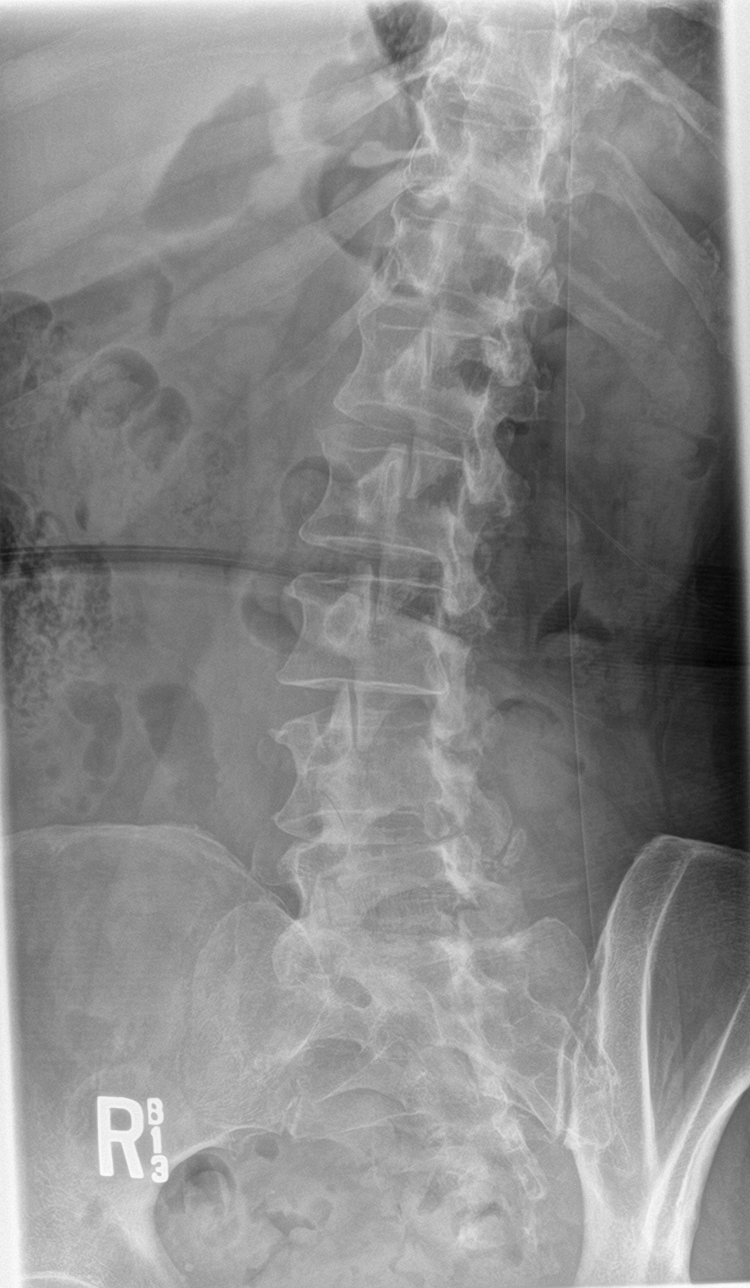

[l-spine obl (2 of 2)]
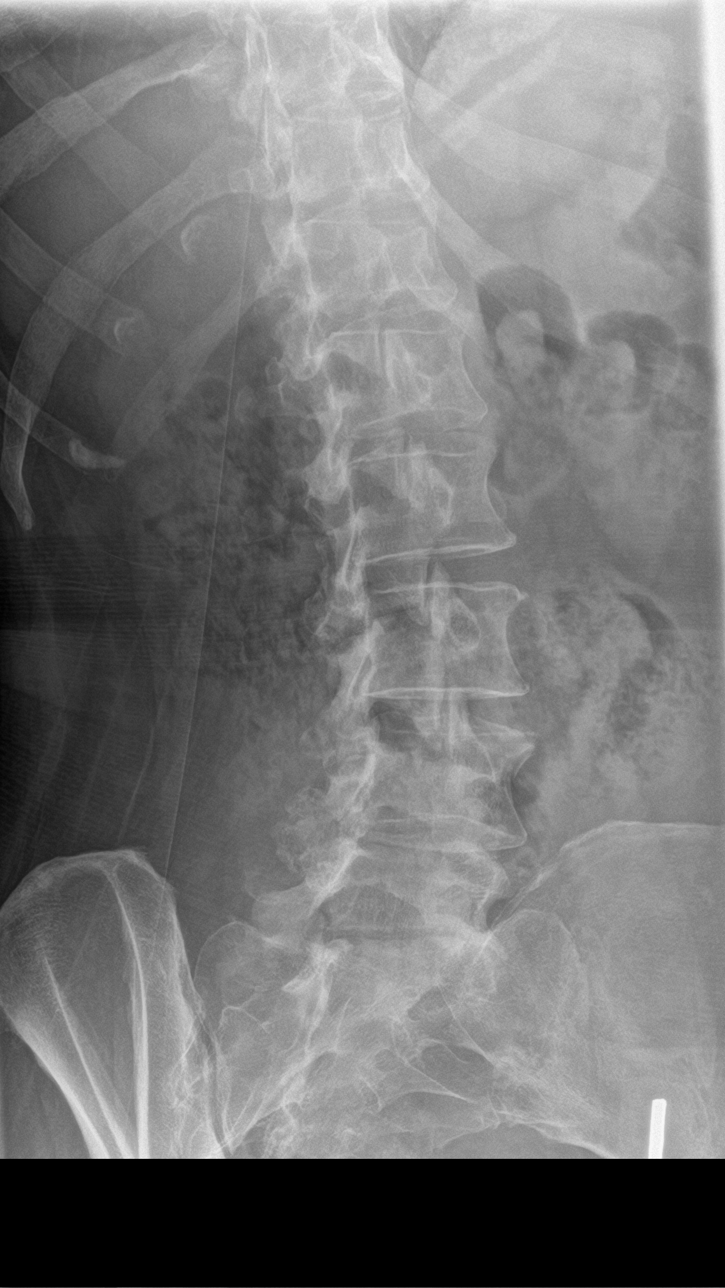

[l-spine lat]
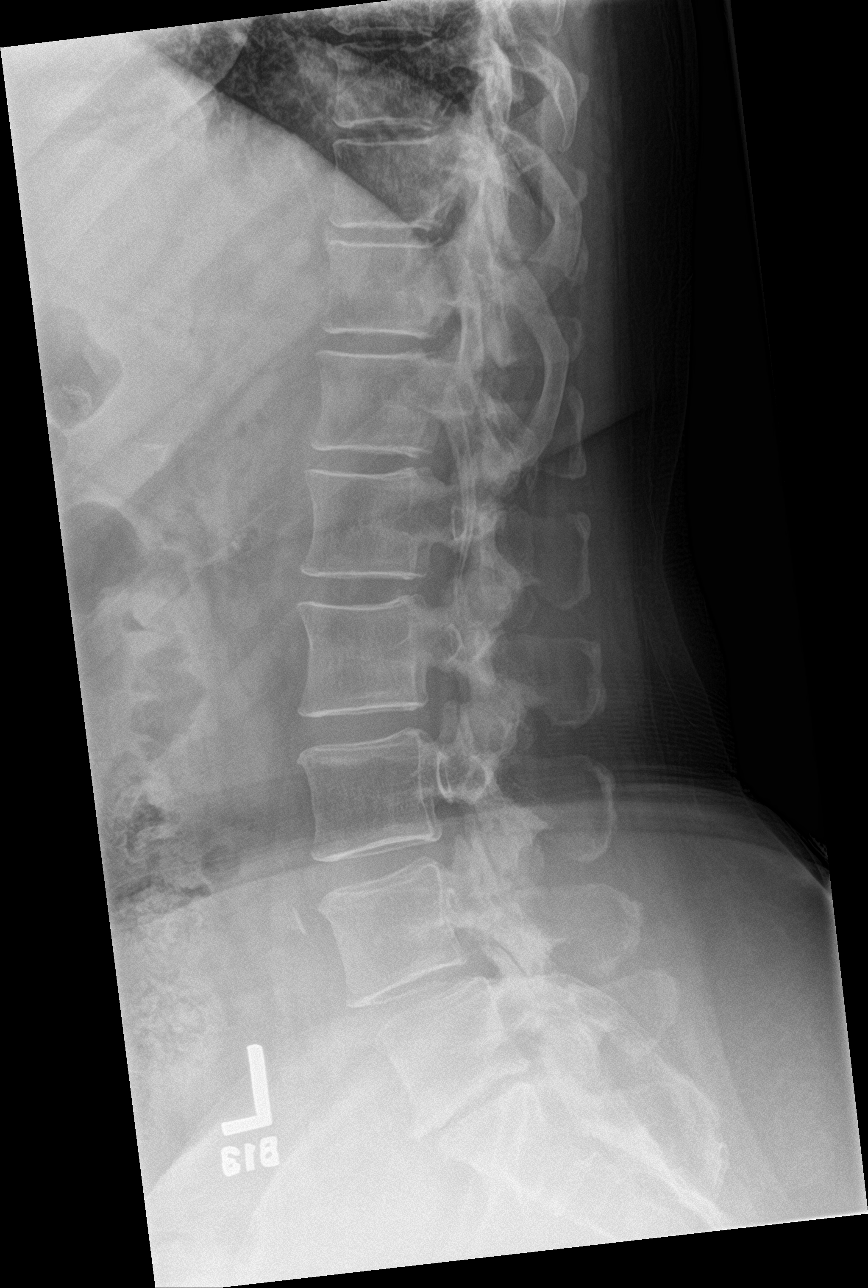

[l-spine spot]
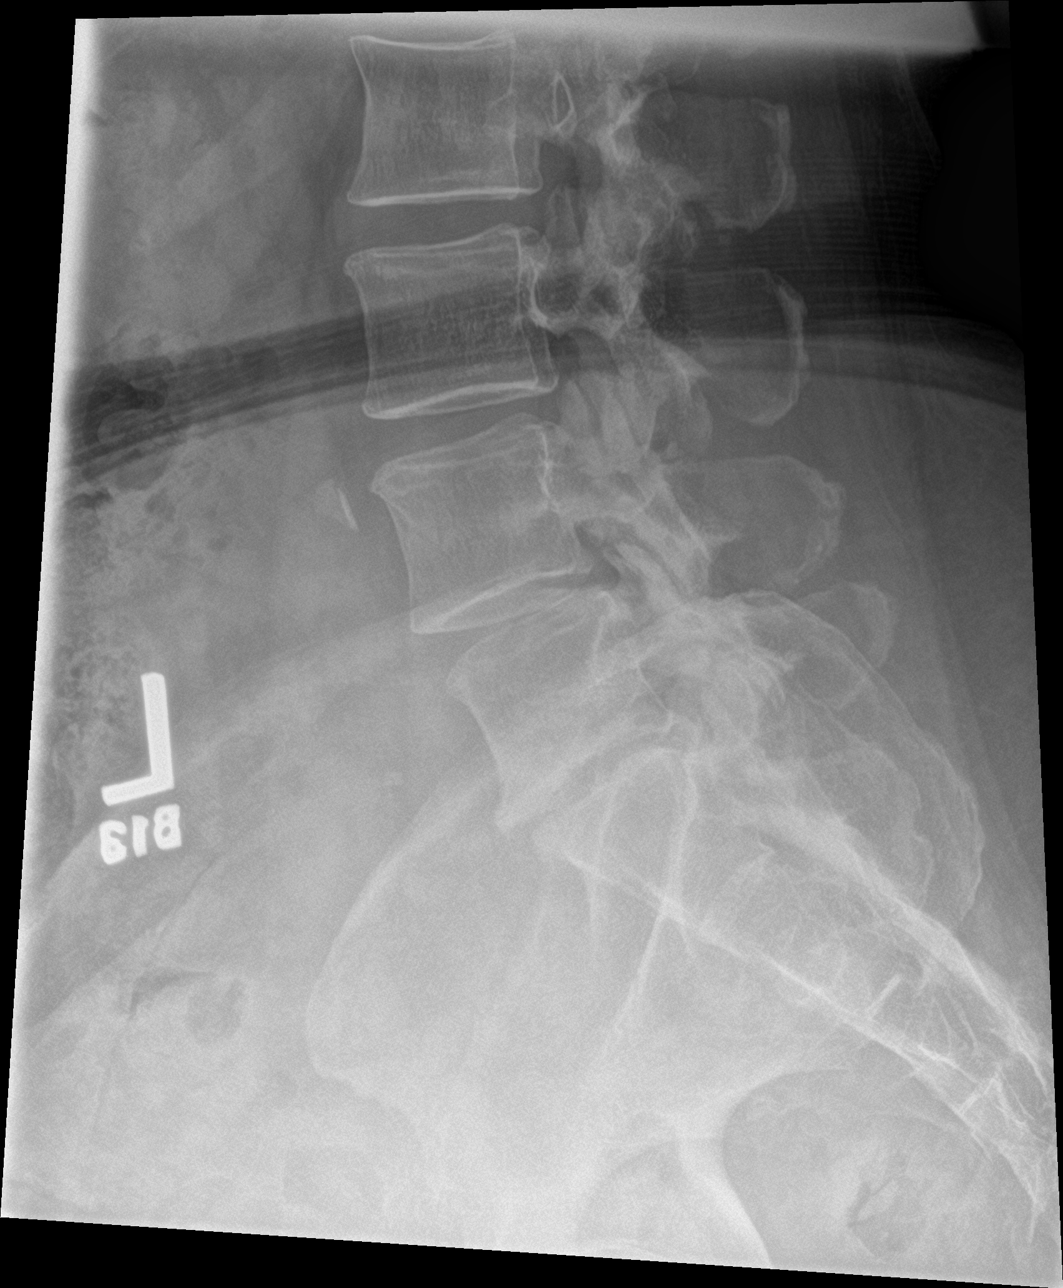

[5 of 5 positions shown; findings below may reference images not displayed]

FINDINGS: Facet DJD and mild narrowing of the interspaces L4-5 and L5-S1 .
Early grade 1 anterolisthesis L4-5 ; no pars defect evident. No
acute fracture. Patchy aortic calcifications.
IMPRESSION: 1. Negative for fracture or other acute bone abnormality.
2. Degenerative changes L4-S1 as above.

## 2019-07-24 ENCOUNTER — Encounter: Payer: Self-pay | Admitting: Family

## 2019-07-24 ENCOUNTER — Other Ambulatory Visit: Payer: Self-pay | Admitting: Family

## 2019-07-24 ENCOUNTER — Encounter: Payer: Self-pay | Admitting: Family Medicine

## 2019-07-24 MED ORDER — ACYCLOVIR 800 MG PO TABS: 800.0000 mg | ORAL_TABLET | Freq: Four times a day (QID) | ORAL | 1 refills | Status: AC

## 2019-07-25 ENCOUNTER — Other Ambulatory Visit: Payer: Self-pay

## 2019-07-25 DIAGNOSIS — G8929 Other chronic pain: Secondary | ICD-10-CM

## 2019-07-26 DIAGNOSIS — F4323 Adjustment disorder with mixed anxiety and depressed mood: Secondary | ICD-10-CM | POA: Diagnosis not present

## 2019-07-31 DIAGNOSIS — M9902 Segmental and somatic dysfunction of thoracic region: Secondary | ICD-10-CM | POA: Diagnosis not present

## 2019-07-31 DIAGNOSIS — M9901 Segmental and somatic dysfunction of cervical region: Secondary | ICD-10-CM | POA: Diagnosis not present

## 2019-07-31 DIAGNOSIS — M9903 Segmental and somatic dysfunction of lumbar region: Secondary | ICD-10-CM | POA: Diagnosis not present

## 2019-07-31 DIAGNOSIS — M47892 Other spondylosis, cervical region: Secondary | ICD-10-CM | POA: Diagnosis not present

## 2019-08-08 DIAGNOSIS — F4323 Adjustment disorder with mixed anxiety and depressed mood: Secondary | ICD-10-CM | POA: Diagnosis not present

## 2019-08-17 ENCOUNTER — Encounter: Payer: Self-pay | Admitting: Family

## 2019-08-17 ENCOUNTER — Other Ambulatory Visit: Payer: Self-pay | Admitting: Family Medicine

## 2019-08-18 ENCOUNTER — Encounter: Payer: Self-pay | Admitting: Gastroenterology

## 2019-08-18 ENCOUNTER — Other Ambulatory Visit: Payer: Self-pay | Admitting: Family

## 2019-08-18 DIAGNOSIS — K921 Melena: Secondary | ICD-10-CM

## 2019-08-19 DIAGNOSIS — Z20828 Contact with and (suspected) exposure to other viral communicable diseases: Secondary | ICD-10-CM | POA: Diagnosis not present

## 2019-08-21 ENCOUNTER — Other Ambulatory Visit: Payer: BC Managed Care – PPO

## 2019-08-21 DIAGNOSIS — Z85828 Personal history of other malignant neoplasm of skin: Secondary | ICD-10-CM | POA: Diagnosis not present

## 2019-08-21 DIAGNOSIS — L813 Cafe au lait spots: Secondary | ICD-10-CM | POA: Diagnosis not present

## 2019-08-21 DIAGNOSIS — L819 Disorder of pigmentation, unspecified: Secondary | ICD-10-CM | POA: Diagnosis not present

## 2019-08-21 DIAGNOSIS — D225 Melanocytic nevi of trunk: Secondary | ICD-10-CM | POA: Diagnosis not present

## 2019-08-22 ENCOUNTER — Encounter: Payer: Self-pay | Admitting: Internal Medicine

## 2019-08-22 ENCOUNTER — Other Ambulatory Visit: Payer: Self-pay

## 2019-08-22 ENCOUNTER — Ambulatory Visit: Payer: BC Managed Care – PPO | Admitting: Internal Medicine

## 2019-08-22 VITALS — BP 124/78 | HR 77 | Temp 97.9°F | Ht 67.0 in | Wt 180.8 lb

## 2019-08-22 DIAGNOSIS — E039 Hypothyroidism, unspecified: Secondary | ICD-10-CM | POA: Diagnosis not present

## 2019-08-22 NOTE — Patient Instructions (Signed)
-   Please remember to hold "Biotin" or any supplements that is good for "hair, skin and nails" for 48-72 hours prior to any thyroid checks.

## 2019-08-22 NOTE — Progress Notes (Signed)
Name: [name]  MRN/ DOB: [id number], Apr 22, 1956    Age/ Sex: 63 y.o., female    PCP: Marrian Salvage, FNP   Reason for Endocrinology Evaluation: Hypothyroidism     Date of Initial Endocrinology Evaluation: 08/22/2019     HPI: [name] is a 63 y.o. female with a past medical history of hypothyroidism . [name] presented for initial endocrinology clinic visit on 08/22/2019 for consultative assistance with her hypothyroidism.   Has been diagnosed with hypothyroidism ~ 15 yrs. Has  always been on dissicated thyroid . Has never been on levothyroxine.   She has been on armour thyroid 60 mg daily for a few months    She was started on HRT ~ 12 yrs ago but stopped in Roberta summer of 2020 due to vaginal bleed. This has been evaluated by GYN with serial ultrasound per pt and no pathology found. Bleeding resolved with discontinuation of HRT.   She denies any recent weight loss, on Roberta contrary she has difficulty losing weight.  She has chronic fatigue that HRT nor LT-4 replacement has helped improve.   She denies diarrhea but has noted black tarry stools , has pending GI referral.   She denies palpitations , but has a transient sob with negative COVID-19 testing.    She takes biotin    She is a pastor      HISTORY:   Past Medical History: History reviewed. No pertinent past medical history.  Past Surgical History: History reviewed. No pertinent surgical history.   Social History:  reports that she has never smoked. She has quit using smokeless tobacco. She reports that she does not drink alcohol or use drugs.  Family History: family history includes Breast cancer in her sister; Rheum arthritis in her mother.   HOME MEDICATIONS: Allergies as of 08/22/2019      Reactions   Penicillins Anaphylaxis   Statins       Medication List       Accurate as of August 22, 2019 11:50 AM. If you have any questions, ask your nurse or doctor.         STOP taking these medications   ESTRADIOL VA Stopped by: Dorita Sciara, MD   progesterone 200 MG Supp Stopped by: Dorita Sciara, MD   TESTOSTERONE COMPOUNDING KIT TD Stopped by: Dorita Sciara, MD     TAKE these medications   allopurinol 300 MG tablet Commonly known as: ZYLOPRIM Take 1 tablet (300 mg total) by mouth daily.   amitriptyline 10 MG tablet Commonly known as: ELAVIL Take 1 tablet (10 mg total) by mouth at bedtime.   ezetimibe 10 MG tablet Commonly known as: Zetia Take 1 tablet (10 mg total) by mouth daily.   gabapentin 100 MG capsule Commonly known as: NEURONTIN Take 2 capsules (200 mg total) by mouth 3 (three) times daily.   thyroid 60 MG tablet Commonly known as: ARMOUR Take 1 tablet by mouth daily.   Vitamin D (Ergocalciferol) 1.25 MG (50000 UT) Caps capsule Commonly known as: DRISDOL TAKE ONE CAPSULE BY MOUTH ONCE WEEKLY         REVIEW OF SYSTEMS: A comprehensive ROS was conducted with [name] and is negative except as per HPI and below:  Review of Systems  Constitutional: Positive for malaise/fatigue. Negative for weight loss.  HENT: Negative for congestion and sore throat.   Respiratory: Positive for shortness of breath. Negative for cough.   Cardiovascular: Negative for chest pain and palpitations.  Gastrointestinal: Negative for diarrhea and nausea.       OBJECTIVE:  VS: BP 124/78 (BP Location: Left Arm, [name] Position: Sitting, Cuff Size: Normal)   Pulse 77   Temp 97.9 F (36.6 C)   Ht 5' 7"  (1.702 m)   Wt 180 lb 12.8 oz (82 kg)   SpO2 99%   BMI 28.32 kg/m    Wt Readings from Last 3 Encounters:  08/22/19 180 lb 12.8 oz (82 kg)  06/26/19 178 lb 3.2 oz (80.8 kg)  02/07/19 181 lb (82.1 kg)     EXAM: General: Pt appears well and is in NAD  Hydration: Well-hydrated with moist mucous membranes and good skin turgor  Eyes: External eye exam normal without stare, lid lag or exophthalmos.  EOM intact.    Ears, Nose, Throat: Hearing: Grossly intact bilaterally Dental: Good dentition  Throat: Clear without mass, erythema or exudate  Neck: General: Supple without adenopathy. Thyroid: Thyroid size normal.  No goiter or nodules appreciated. No thyroid bruit.  Lungs: Clear with good BS bilat with no rales, rhonchi, or wheezes  Heart: Auscultation: RRR.  Abdomen: Normoactive bowel sounds, soft, nontender, without masses or organomegaly palpable  Extremities: Gait and station: Normal gait  Digits and nails: No clubbing, cyanosis, petechiae, or nodes Head and neck: Normal alignment and mobility BL UE: Normal ROM and strength. BL LE: No pretibial edema normal ROM and strength.  Skin: Hair: Texture and amount normal with gender appropriate distribution Skin Inspection: No rashes Skin Palpation: Skin temperature, texture, and thickness normal to palpation  Neuro: Cranial nerves: II - XII grossly intact  Motor: Normal strength throughout DTRs: 2+ and symmetric in UE without delay in relaxation phase  Mental Status: Judgment, insight: Intact Orientation: Oriented to time, place, and person Mood and affect: No depression, anxiety, or agitation     DATA REVIEWED:   Results for Boisclair, Briarrose Shor (MRN [id number]) as of 08/22/2019 11:51  Ref. Range 05/25/2019 07:48  TSH Latest Ref Range: 0.35 - 4.50 uIU/mL 0.20 (L)    ASSESSMENT/PLAN/RECOMMENDATIONS:   1. Hypothyroidism:   - Multiple non-specific symptoms that could be attributed to her thyroid, unclear her thyroid function given that in 05/2019 she was on Biotin and could be Roberta reason for a falsely low TSH level.  - She was advised moving forward to hold off on Biotin intake for 48-72 hrs prior to thyroid check -  I have advised [name] for my preference to go with Levothyroxine rather then armour thyroid, due to more stability with T4 content in levothyroxine and also armour thyroid has non-physiologic levels of T4:T3 of  4:1  (physiologic levels 13:1 to 16:1)   Medications : Continue Armour thyroid 60 mg daily      F/u in 3 months     Signed electronically by: Mack Guise, MD  [facility] Endocr[facility]ology  [address] [place], [place] [address] Phone: [phone] FAX: [phone]   CC: Marrian Salvage, [place] [address] Phone: [phone] Fax: [phone]   Return to Endocrinology clinic as below: Future Appointments  Date Time Provider [place]  08/25/2019 10:30 AM [facility] LAB [facility] None  09/01/2019  9:00 AM GI-315 DG C-ARM RM 3 GI-315DG GI-315 W. WE  09/21/2019  8:30 AM Mansouraty, Telford Nab., MD [facility]  11/20/2019 10:10 AM Samiya Mervin, Melanie Crazier, MD [facility] None

## 2019-08-25 ENCOUNTER — Other Ambulatory Visit: Payer: BC Managed Care – PPO

## 2019-08-27 ENCOUNTER — Other Ambulatory Visit: Payer: Self-pay | Admitting: Family Medicine

## 2019-08-28 ENCOUNTER — Other Ambulatory Visit: Payer: Self-pay | Admitting: Family

## 2019-09-01 ENCOUNTER — Other Ambulatory Visit: Payer: Self-pay

## 2019-09-01 ENCOUNTER — Other Ambulatory Visit: Payer: Self-pay | Admitting: Family Medicine

## 2019-09-01 ENCOUNTER — Ambulatory Visit
Admission: RE | Admit: 2019-09-01 | Discharge: 2019-09-01 | Disposition: A | Discharge status: Home / Self Care | Payer: BC Managed Care – PPO | Source: Ambulatory Visit | Attending: Family Medicine | Admitting: Family Medicine

## 2019-09-01 DIAGNOSIS — M545 Low back pain, unspecified: Secondary | ICD-10-CM

## 2019-09-01 DIAGNOSIS — G8929 Other chronic pain: Secondary | ICD-10-CM

## 2019-09-01 MED ORDER — METHYLPREDNISOLONE ACETATE 40 MG/ML INJ SUSP (RADIOLOG
120.0000 mg | Freq: Once | INTRAMUSCULAR | Status: AC
  Administered 2019-09-01: 10:00:00 120 mg via EPIDURAL

## 2019-09-01 MED ORDER — IOPAMIDOL (ISOVUE-M 200) INJECTION 41%
1.0000 mL | Freq: Once | INTRAMUSCULAR | Status: AC
  Administered 2019-09-01: 1 mL via EPIDURAL

## 2019-09-01 NOTE — Discharge Instructions (Signed)

## 2019-09-05 DIAGNOSIS — F4323 Adjustment disorder with mixed anxiety and depressed mood: Secondary | ICD-10-CM | POA: Diagnosis not present

## 2019-09-06 ENCOUNTER — Encounter: Payer: Self-pay | Admitting: Family

## 2019-09-08 ENCOUNTER — Other Ambulatory Visit: Payer: Self-pay | Admitting: Family

## 2019-09-08 ENCOUNTER — Other Ambulatory Visit (INDEPENDENT_AMBULATORY_CARE_PROVIDER_SITE_OTHER): Payer: BC Managed Care – PPO

## 2019-09-08 ENCOUNTER — Other Ambulatory Visit: Payer: Self-pay

## 2019-09-08 DIAGNOSIS — E039 Hypothyroidism, unspecified: Secondary | ICD-10-CM | POA: Diagnosis not present

## 2019-09-08 LAB — T4, FREE: Free T4: 0.74 ng/dL (ref 0.60–1.60)

## 2019-09-08 LAB — TSH: TSH: 0.22 u[IU]/mL — ABNORMAL LOW (ref 0.35–4.50)

## 2019-09-08 MED ORDER — AMITRIPTYLINE HCL 25 MG PO TABS: 25.0000 mg | ORAL_TABLET | Freq: Every day | ORAL | 1 refills | Status: DC

## 2019-09-20 DIAGNOSIS — M9901 Segmental and somatic dysfunction of cervical region: Secondary | ICD-10-CM | POA: Diagnosis not present

## 2019-09-20 DIAGNOSIS — M47892 Other spondylosis, cervical region: Secondary | ICD-10-CM | POA: Diagnosis not present

## 2019-09-20 DIAGNOSIS — M9902 Segmental and somatic dysfunction of thoracic region: Secondary | ICD-10-CM | POA: Diagnosis not present

## 2019-09-20 DIAGNOSIS — M9903 Segmental and somatic dysfunction of lumbar region: Secondary | ICD-10-CM | POA: Diagnosis not present

## 2019-09-21 ENCOUNTER — Other Ambulatory Visit: Payer: Self-pay

## 2019-09-21 ENCOUNTER — Encounter: Payer: Self-pay | Admitting: Gastroenterology

## 2019-09-21 ENCOUNTER — Other Ambulatory Visit (INDEPENDENT_AMBULATORY_CARE_PROVIDER_SITE_OTHER): Payer: BC Managed Care – PPO

## 2019-09-21 ENCOUNTER — Ambulatory Visit (INDEPENDENT_AMBULATORY_CARE_PROVIDER_SITE_OTHER): Payer: BC Managed Care – PPO | Admitting: Gastroenterology

## 2019-09-21 VITALS — BP 120/72 | HR 78 | Temp 98.4°F | Ht 67.0 in | Wt 179.0 lb

## 2019-09-21 DIAGNOSIS — R1032 Left lower quadrant pain: Secondary | ICD-10-CM

## 2019-09-21 DIAGNOSIS — R194 Change in bowel habit: Secondary | ICD-10-CM

## 2019-09-21 DIAGNOSIS — R195 Other fecal abnormalities: Secondary | ICD-10-CM

## 2019-09-21 DIAGNOSIS — F4323 Adjustment disorder with mixed anxiety and depressed mood: Secondary | ICD-10-CM | POA: Diagnosis not present

## 2019-09-21 LAB — BASIC METABOLIC PANEL
BUN: 13 mg/dL (ref 6–23)
CO2: 28 mEq/L (ref 19–32)
Calcium: 9.3 mg/dL (ref 8.4–10.5)
Chloride: 107 mEq/L (ref 96–112)
Creatinine, Ser: 0.91 mg/dL (ref 0.40–1.20)
GFR: 62.77 mL/min (ref >60.00)
Glucose, Bld: 93 mg/dL (ref 70–99)
Potassium: 3.9 mEq/L (ref 3.5–5.1)
Sodium: 141 mEq/L (ref 135–145)

## 2019-09-21 LAB — CBC
HCT: 34.2 % — ABNORMAL LOW (ref 36.0–46.0)
Hemoglobin: 11.6 g/dL — ABNORMAL LOW (ref 12.0–15.0)
MCHC: 33.8 g/dL (ref 30.0–36.0)
MCV: 89.9 fl (ref 78.0–100.0)
Platelets: 318 10*3/uL (ref 150.0–400.0)
RBC: 3.81 Mil/uL — ABNORMAL LOW (ref 3.87–5.11)
RDW: 13.4 % (ref 11.5–15.5)
WBC: 6.2 10*3/uL (ref 4.0–10.5)

## 2019-09-21 LAB — IBC + FERRITIN
Ferritin: 11.3 ng/mL (ref 10.0–291.0)
Iron: 35 ug/dL — ABNORMAL LOW (ref 42–145)
Saturation Ratios: 8.4 % — ABNORMAL LOW (ref 20.0–50.0)
Transferrin: 298 mg/dL (ref 212.0–360.0)

## 2019-09-21 LAB — FOLATE: Folate: >24.1 ng/mL (ref >5.9)

## 2019-09-21 LAB — VITAMIN B12: Vitamin B-12: 417 pg/mL (ref 211–911)

## 2019-09-21 NOTE — Patient Instructions (Signed)
You have been scheduled for an abdominal ultrasound at [facility] Radiology (1st floor of hospital) on 10/26/520 at 9:30am. Please arrive 15 minutes prior to your appointment for registration. Make certain not to have anything to eat or drink 6 hours prior to your appointment. Should you need to reschedule your appointment, please contact radiology at [phone]. This test typically takes about 30 minutes to perform.  Your provider has requested that you go to the basement level for lab work before leaving today. Press "B" on the elevator. The lab is located at the first door on the left as you exit the elevator.  You have been scheduled for an endoscopy. Please follow written instructions given to you at your visit today. If you use inhalers (even only as needed), please bring them with you on the day of your procedure.   If you are age 70 or younger, your body mass index should be between 19-25. Your Body mass index is 28.04 kg/m. If this is out of the aformentioned range listed, please consider follow up with your Primary Care Provider.   Thank you for choosing me and [facility].  Dr. Rush Landmark

## 2019-09-21 NOTE — Progress Notes (Signed)
[place] VISIT   Primary Care Provider Marrian Salvage, [place] [address] [phone]  Referring Provider Marrian Salvage, [address] [place],  [place] [address] [phone]  Patient Profile: [name] is a 63 y.o. female with a pmh significant for hypothyroidism, hyperlipidemia, gout, family history of colon polyps.  The patient presents to the [facility] for an evaluation and management of problem(s) noted below:  Problem List 1. Dark stools   2. LLQ pain   3. Change in bowel habit     History of Present Illness This is the patient's first visit to the outpatient [facility].  The patient has had abdominal discomfort of an unclear etiology in the left lower quadrant region.  Over the course the last few months she has had multiple vaginal ultrasounds because she was recently on hormone replacement therapy.  After one of the ultrasound she had significant discomfort in that area.  This discomfort is not clearly delineated with food intake or fasting.  She has had indigestion at times over the course of years previously.  She takes a significant amount of fiber in her diet.  She has a daily bowel movement on a normal basis.  Approximately 4 to 5 weeks ago she developed 5 days worth of dark and tarry stools.  At that time she was undergoing a very stressful.  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  She began taking apple vinegar cider as well as Prilosec 20 mg for approximately 5 to 6 days.  The black stools improved and she went back to her normal daily bowel habits.  She has had in the past hemorrhoidal bleeding per her report.  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  She has a family history  significant for colon cancer in a paternal aunt in her 40s and her parents have a history of colon polyps (unclear if advanced adenomatous were found).  She had her negative Cologuard in October 2019.  She has never had this type of change in bowel habits previously.  Currently feeling better with complete cessation of her symptoms other than infrequent indigestion and his left lower quadrant discomfort coming and going.  It is not preventing her from doing her activities of daily living.  She has never had an upper endoscopy.  She does not take significant nonsteroidals or BC Goody powders and was not taking any at the time of her dark stools.  GI Review of Systems Positive as above Negative for pyrosis, dysphagia, odynophagia, early satiety, bloating, hematochezia currently or recently  Review of Systems General: Denies fevers/chills/weight loss HEENT: Denies oral lesions Cardiovascular: Denies chest pain Pulmonary: Denies shortness of breath/nocturnal cough Gastroenterological: See HPI Genitourinary: Denies darkened urine Hematological: Denies easy bruising/bleeding Endocrine: Denies temperature intolerance Dermatological: Denies jaundice Psychological: Mood is stable after her recent increased stress levels at work   Medications Current Outpatient Medications  Medication Sig Dispense Refill   allopurinol (ZYLOPRIM) 300 MG tablet Take 1 tablet (300 mg total) by mouth daily. 90 tablet 3   amitriptyline (ELAVIL) 25 MG tablet Take 1 tablet (25 mg total) by mouth at bedtime. 90 tablet 1   gabapentin (NEURONTIN) 100 MG capsule Take 2 capsules (200 mg total) by mouth 3 (three) times  daily. 180 capsule 3   thyroid (ARMOUR) 60 MG tablet Take 1 tablet by mouth daily.     No current facility-administered medications for this visit.     Allergies Allergies  Allergen Reactions   Penicillins Anaphylaxis   Statins     Histories Past Medical History:  Diagnosis Date   Gout     Hyperlipidemia    Hypothyroidism    Past Surgical History:  Procedure Laterality Date   NO PAST SURGERIES     Social History   Socioeconomic History   Marital status: Married    Spouse name: Not on file   Number of children: 1   Years of education: Not on file   Highest education level: Not on file  Occupational History   Occupation: Theme park manager    Comment: Transport planner  Social Needs   Financial resource strain: Not on file   Food insecurity    Worry: Not on file    Inability: Not on file   Transportation needs    Medical: Not on file    Non-medical: Not on file  Tobacco Use   Smoking status: Never Smoker   Smokeless tobacco: Never Used  Substance and Sexual Activity   Alcohol use: Never    Frequency: Never   Drug use: Never   Sexual activity: Not on file  Lifestyle   Physical activity    Days per week: Not on file    Minutes per session: Not on file   Stress: Not on file  Relationships   Social connections    Talks on phone: Not on file    Gets together: Not on file    Attends religious service: Not on file    Active member of club or organization: Not on file    Attends meetings of clubs or organizations: Not on file    Relationship status: Not on file   Intimate partner violence    Fear of current or ex partner: Not on file    Emotionally abused: Not on file    Physically abused: Not on file    Forced sexual activity: Not on file  Other Topics Concern   Not on file  Social History Narrative   Not on file   Family History  Problem Relation Age of Onset   Breast cancer Sister    Rheum arthritis Mother    Colon polyps Mother    COPD Father    Colon polyps Father    Colon cancer Paternal Aunt    Esophageal cancer Neg Hx    Inflammatory bowel disease Neg Hx    Pancreatic cancer Neg Hx    Liver disease Neg Hx    Stomach cancer Neg Hx    I have reviewed her medical, social, and family history in detail and updated the  electronic medical record as necessary.    PHYSICAL EXAMINATION  BP 120/72    Pulse 78    Temp 98.4 F (36.9 C)    Ht 5\' 7"  (1.702 m)    Wt 179 lb (81.2 kg)    BMI 28.04 kg/m  Wt Readings from Last 3 Encounters:  09/21/19 179 lb (81.2 kg)  08/22/19 180 lb 12.8 oz (82 kg)  06/26/19 178 lb 3.2 oz (80.8 kg)  GEN: NAD, appears stated age, doesn't appear chronically ill PSYCH: Cooperative, without pressured speech EYE: Conjunctivae pink, sclerae anicteric ENT: MMM, without oral ulcers, no erythema or exudates noted NECK: Supple CV: RR without R/Gs  RESP: CTAB posteriorly, without  wheezing GI: NABS, soft, NT/ND, without rebound or guarding, no HSM appreciated MSK/EXT: No lower extremity edema SKIN: No jaundice NEURO:  Alert & Oriented x 3, no focal deficits   REVIEW OF DATA  I reviewed the following data at the time of this encounter:  GI Procedures and Studies  October 2019 Cologuard Negative  Laboratory Studies  Reviewed those labs in epic  Imaging Studies  No relevant imaging studies to review   ASSESSMENT  Ms. Couchman is a 63 y.o. female with a pmh significant for hypothyroidism, hyperlipidemia, gout, family history of colon polyps.  The patient is seen today for evaluation and management of:  1. Dark stools   2. LLQ pain   3. Change in bowel habit    The patient is clinically and hemodynamically stable at this time.  Her initial consultation request for evaluation of change in bowel habits does require further evaluation endoscopically.  She may have some gastritis at the time in the setting of significant stress and anxiety however need to rule out PUD as well as other etiologies for potential upper GI sources of bleeding.  She has not had any blood work done recently and we need to evaluate whether she is developing signs of anemia.  Given that she has been screened for colon cancer with Cologuard testing within the last year if she has evidence of anemia or iron deficiency  she will require a colonoscopy for further evaluation.  She wants to hold on that if possible.  She will remain on her high-fiber diet.  As her discomfort and dark stools have normalized I am going to hold on PPI therapy for now.  Her left lower quadrant abdominal discomfort not clearly delineated.  She is pretty sure it occurred after a vaginal ultrasound.  I will begin her work-up with an abdominal ultrasound but this is unremarkable and we do not pursue a colonoscopy in the setting of her recent dark stools, she may benefit from a diagnostic colonoscopy and she may also require CT abdomen/pelvis.  The risks and benefits of endoscopic evaluation were discussed with the patient; these include but are not limited to the risk of perforation, infection, bleeding, missed lesions, lack of diagnosis, severe illness requiring hospitalization, as well as anesthesia and sedation related illnesses.  The patient is agreeable to proceed.  All patient questions were answered, to the best of my ability, and the patient agrees to the aforementioned plan of action with follow-up as indicated.   PLAN  Laboratories as outlined below As patient is currently pain-free hold on restart of PPI therapy Proceed with diagnostic endoscopy with at least gastric/duodenal biopsies Continue to monitor stools daily Continue high-fiber diet Abdominal ultrasound to be ordered for better evaluation of left lower quadrant abdominal discomfort may need diagnostic colonoscopy and CT abdomen/pelvis in future   Orders Placed This Encounter  Procedures   SARS Coronavirus 2 ([facility] Endo/Gastro ONLY)   US Abdomen Complete   CBC   Basic Metabolic Panel (BMET)   IBC + Ferritin   B12   Folate   Ambulatory referral to Gastroenterology    New Prescriptions   No medications on file   Modified Medications   No medications on file    Planned Follow Up No follow-ups on file.   Justice Britain, MD [place]  Gastroenterology Advanced Endoscopy Office # [id number]

## 2019-09-22 ENCOUNTER — Telehealth: Payer: Self-pay | Admitting: Gastroenterology

## 2019-09-22 NOTE — Telephone Encounter (Signed)
Returned call but had to leave message with Morey Hummingbird asking for return call.

## 2019-09-24 ENCOUNTER — Encounter: Payer: Self-pay | Admitting: Gastroenterology

## 2019-09-25 ENCOUNTER — Encounter: Payer: Self-pay | Admitting: Family Medicine

## 2019-09-25 ENCOUNTER — Other Ambulatory Visit: Payer: Self-pay

## 2019-09-25 ENCOUNTER — Telehealth: Payer: Self-pay

## 2019-09-25 ENCOUNTER — Ambulatory Visit (HOSPITAL_COMMUNITY)
Admission: RE | Admit: 2019-09-25 | Discharge: 2019-09-25 | Disposition: A | Discharge status: Home / Self Care | Payer: BC Managed Care – PPO | Source: Ambulatory Visit | Attending: Gastroenterology | Admitting: Gastroenterology

## 2019-09-25 DIAGNOSIS — R195 Other fecal abnormalities: Secondary | ICD-10-CM | POA: Insufficient documentation

## 2019-09-25 DIAGNOSIS — R1032 Left lower quadrant pain: Secondary | ICD-10-CM | POA: Insufficient documentation

## 2019-09-25 DIAGNOSIS — R194 Change in bowel habit: Secondary | ICD-10-CM | POA: Insufficient documentation

## 2019-09-25 DIAGNOSIS — D509 Iron deficiency anemia, unspecified: Secondary | ICD-10-CM

## 2019-09-25 NOTE — Telephone Encounter (Signed)
[name], Please work on getting this patient rescheduled for both an upper and lower endoscopy due to the findings of iron deficiency anemia. Thank you. GM

## 2019-09-25 NOTE — Telephone Encounter (Signed)
Patty, Please work on getting this patient rescheduled for both an upper and lower endoscopy due to the findings of iron deficiency anemia. Thank you. GM

## 2019-09-25 NOTE — Telephone Encounter (Signed)
[name] has the pt scheduled for endo colon

## 2019-09-26 ENCOUNTER — Encounter: Payer: Self-pay | Admitting: Gastroenterology

## 2019-09-26 ENCOUNTER — Other Ambulatory Visit: Payer: Self-pay

## 2019-09-26 DIAGNOSIS — F4323 Adjustment disorder with mixed anxiety and depressed mood: Secondary | ICD-10-CM | POA: Diagnosis not present

## 2019-09-26 DIAGNOSIS — D509 Iron deficiency anemia, unspecified: Secondary | ICD-10-CM

## 2019-09-26 MED ORDER — SUPREP BOWEL PREP KIT 17.5-3.13-1.6 GM/177ML PO SOLN: 1.0000 | ORAL | 0 refills | Status: DC

## 2019-09-26 NOTE — Addendum Note (Signed)
Addended by: Debbe Mounts on: 09/26/2019 09:00 AM   Modules accepted: Orders

## 2019-09-26 NOTE — Telephone Encounter (Signed)
Yes, I was able to add her colon to her already scheduled endo on 10/03/19. Dr Rush Landmark sent this to me in his results note that he released to pt's mychart. I spoke with her on yesterday. She is fine with recommendations and I have sent her prep instructions via mychart.

## 2019-09-29 ENCOUNTER — Other Ambulatory Visit (INDEPENDENT_AMBULATORY_CARE_PROVIDER_SITE_OTHER): Payer: BC Managed Care – PPO

## 2019-09-29 ENCOUNTER — Other Ambulatory Visit: Payer: Self-pay | Admitting: Gastroenterology

## 2019-09-29 DIAGNOSIS — D509 Iron deficiency anemia, unspecified: Secondary | ICD-10-CM

## 2019-09-29 DIAGNOSIS — Z1159 Encounter for screening for other viral diseases: Secondary | ICD-10-CM | POA: Diagnosis not present

## 2019-09-29 LAB — SARS CORONAVIRUS 2 (TAT 6-24 HRS): SARS Coronavirus 2: NEGATIVE

## 2019-09-29 LAB — HEPATIC FUNCTION PANEL
ALT: 11 U/L (ref 0–35)
AST: 12 U/L (ref 0–37)
Albumin: 4.4 g/dL (ref 3.5–5.2)
Alkaline Phosphatase: 53 U/L (ref 39–117)
Bilirubin, Direct: 0 mg/dL (ref 0.0–0.3)
Total Bilirubin: 0.4 mg/dL (ref 0.2–1.2)
Total Protein: 7 g/dL (ref 6.0–8.3)

## 2019-09-29 LAB — PROTIME-INR
INR: 1 ratio (ref 0.8–1.0)
Prothrombin Time: 12.2 s (ref 9.6–13.1)

## 2019-10-02 ENCOUNTER — Other Ambulatory Visit: Payer: Self-pay | Admitting: Family

## 2019-10-02 ENCOUNTER — Encounter: Payer: Self-pay | Admitting: Family

## 2019-10-02 ENCOUNTER — Encounter: Payer: Self-pay | Admitting: *Deleted

## 2019-10-02 DIAGNOSIS — M9903 Segmental and somatic dysfunction of lumbar region: Secondary | ICD-10-CM | POA: Diagnosis not present

## 2019-10-02 DIAGNOSIS — M9902 Segmental and somatic dysfunction of thoracic region: Secondary | ICD-10-CM | POA: Diagnosis not present

## 2019-10-02 DIAGNOSIS — M9901 Segmental and somatic dysfunction of cervical region: Secondary | ICD-10-CM | POA: Diagnosis not present

## 2019-10-02 DIAGNOSIS — M47892 Other spondylosis, cervical region: Secondary | ICD-10-CM | POA: Diagnosis not present

## 2019-10-02 MED ORDER — AZITHROMYCIN 250 MG PO TABS: ORAL_TABLET | ORAL | 0 refills | Status: DC

## 2019-10-03 ENCOUNTER — Other Ambulatory Visit: Payer: Self-pay

## 2019-10-03 ENCOUNTER — Ambulatory Visit (AMBULATORY_SURGERY_CENTER): Payer: BC Managed Care – PPO | Admitting: Gastroenterology

## 2019-10-03 ENCOUNTER — Encounter: Payer: Self-pay | Admitting: Gastroenterology

## 2019-10-03 VITALS — BP 122/80 | HR 56 | Temp 98.0°F | Resp 9 | Ht 67.0 in | Wt 179.0 lb

## 2019-10-03 DIAGNOSIS — K259 Gastric ulcer, unspecified as acute or chronic, without hemorrhage or perforation: Secondary | ICD-10-CM | POA: Diagnosis not present

## 2019-10-03 DIAGNOSIS — D123 Benign neoplasm of transverse colon: Secondary | ICD-10-CM

## 2019-10-03 DIAGNOSIS — K2100 Gastro-esophageal reflux disease with esophagitis, without bleeding: Secondary | ICD-10-CM | POA: Diagnosis not present

## 2019-10-03 DIAGNOSIS — R195 Other fecal abnormalities: Secondary | ICD-10-CM

## 2019-10-03 DIAGNOSIS — K2950 Unspecified chronic gastritis without bleeding: Secondary | ICD-10-CM | POA: Diagnosis not present

## 2019-10-03 DIAGNOSIS — D122 Benign neoplasm of ascending colon: Secondary | ICD-10-CM

## 2019-10-03 DIAGNOSIS — K254 Chronic or unspecified gastric ulcer with hemorrhage: Secondary | ICD-10-CM | POA: Diagnosis not present

## 2019-10-03 DIAGNOSIS — K2951 Unspecified chronic gastritis with bleeding: Secondary | ICD-10-CM | POA: Diagnosis not present

## 2019-10-03 MED ORDER — SODIUM CHLORIDE 0.9 % IV SOLN: 500.0000 mL | Freq: Once | INTRAVENOUS | Status: DC

## 2019-10-03 MED ORDER — OMEPRAZOLE 40 MG PO CPDR: 40.0000 mg | DELAYED_RELEASE_CAPSULE | Freq: Two times a day (BID) | ORAL | 2 refills | Status: DC

## 2019-10-03 MED ORDER — FERROUS SULFATE 325 (65 FE) MG PO TABS: 325.0000 mg | ORAL_TABLET | Freq: Every day | ORAL | 2 refills | Status: DC

## 2019-10-03 NOTE — Progress Notes (Signed)
Called to room to assist during endoscopic procedure.  Patient ID and intended procedure confirmed with present staff. Received instructions for my participation in the procedure from the performing physician.  

## 2019-10-03 NOTE — Op Note (Signed)
[facility] Patient Name: [name] Procedure Date: 10/03/2019 9:21 AM MRN: [id number] Endoscopist: Justice Britain , MD Age: 63 Referring MD:  Date of Birth: [birth year] Gender: Female Account #: [id number] Procedure:                Upper GI endoscopy Indications:              Iron deficiency anemia, Melena Medicines:                Monitored Anesthesia Care Procedure:                Pre-Anesthesia Assessment:                           - Prior to the procedure, a History and Physical                            was performed, and patient medications and                            allergies were reviewed. The patient's tolerance of                            previous anesthesia was also reviewed. The risks                            and benefits of the procedure and the sedation                            options and risks were discussed with the patient.                            All questions were answered, and informed consent                            was obtained. Prior Anticoagulants: The patient has                            taken no previous anticoagulant or antiplatelet                            agents. ASA Grade Assessment: II - A patient with                            mild systemic disease. After reviewing the risks                            and benefits, the patient was deemed in                            satisfactory condition to undergo the procedure.                           After obtaining informed consent, the endoscope was  passed under direct vision. Throughout the                            procedure, the patient's blood pressure, pulse, and                            oxygen saturations were monitored continuously. The                            Endoscope was introduced through the mouth, and                            advanced to the second part of duodenum. The upper                            GI endoscopy was  accomplished without difficulty.                            The patient tolerated the procedure. Scope In: Scope Out: Findings:                 No gross lesions were noted in the proximal                            esophagus and in the mid esophagus.                           LA Grade B (one or more mucosal breaks greater than                            5 mm, not extending between the tops of two mucosal                            folds) esophagitis with no bleeding was found at                            the gastroesophageal junction.                           Patchy mildly erythematous mucosa without bleeding                            was found in the entire examined stomach. Biopsies                            were taken with a cold forceps for histology and                            Helicobacter pylori testing.                           One non-bleeding cratered gastric ulcer with a  clean ulcer base (Forrest Class III) was found on                            the anterior wall of the gastric antrum. The lesion                            was 20 mm in largest dimension. There were                            associated changes consistent with congestion and                            granulation that surrounded the lesion extending to                            the prepylorus. This does not have the typical                            appearance of a gastric malignancy, but will need                            to be closely monitored. Biopsies were taken with a                            cold forceps for histology from the ulcer bed and                            the surrounding tissue to rule out any adenomatous                            changes.                           No gross lesions were noted in the duodenal bulb,                            in the first portion of the duodenum and in the                            second portion of the  duodenum. Complications:            No immediate complications. Estimated Blood Loss:     Estimated blood loss was minimal. Impression:               - No gross lesions in esophagus. LA Grade B                            esophagitis with no bleeding at [employer].                           - Erythematous mucosa in the stomach. Biopsied for  HP.                           - Non-bleeding gastric ulcer with a clean ulcer                            base (Forrest Class III) but associated mucosal                            changes surrounding. Biopsied to rule out                            adenomatous change/malignancy.                           - No gross lesions in the duodenal bulb, in the                            first portion of the duodenum and in the second                            portion of the duodenum. Recommendation:           - Proceed to scheduled colonoscopy.                           - Start Omeprazole 40 mg BID for next 35-months.                           - Observe patient's clinical course.                           - Await pathology results.                           - Repeat upper endoscopy in 2 months to check                            healing.                           - The findings and recommendations were discussed                            with the patient.                           - The findings and recommendations were discussed                            with the patient's family. Justice Britain, MD 10/03/2019 10:28:10 AM

## 2019-10-03 NOTE — Progress Notes (Signed)
A/ox3, pleased with MAC, report to RN 

## 2019-10-03 NOTE — Op Note (Signed)
[place] Patient Name: [name] Procedure Date: 10/03/2019 9:20 AM MRN: [id number] Endoscopist: Justice Britain , MD Age: 63 Referring MD:  Date of Birth: [birth year] Gender: Female Account #: [id number] Procedure:                Colonoscopy Indications:              This is the patient's first colonoscopy - previous                            Negative Cologuard, Iron deficiency anemia Medicines:                Monitored Anesthesia Care Procedure:                Pre-Anesthesia Assessment:                           - Prior to the procedure, a History and Physical                            was performed, and patient medications and                            allergies were reviewed. The patient's tolerance of                            previous anesthesia was also reviewed. The risks                            and benefits of the procedure and the sedation                            options and risks were discussed with the patient.                            All questions were answered, and informed consent                            was obtained. Prior Anticoagulants: The patient has                            taken no previous anticoagulant or antiplatelet                            agents. ASA Grade Assessment: II - A patient with                            mild systemic disease. After reviewing the risks                            and benefits, the patient was deemed in                            satisfactory condition to undergo the procedure.  After obtaining informed consent, the colonoscope                            was passed under direct vision. Throughout the                            procedure, the patient's blood pressure, pulse, and                            oxygen saturations were monitored continuously. The                            Colonoscope was introduced through the anus and                            advanced to  the the cecum, identified by                            appendiceal orifice and ileocecal valve. The                            colonoscopy was performed without difficulty. The                            patient tolerated the procedure. The quality of the                            bowel preparation was good. The ileocecal valve,                            appendiceal orifice, and rectum were photographed. Scope In: 9:43:56 AM Scope Out: 10:12:42 AM Scope Withdrawal Time: 0 hours 24 minutes 7 seconds  Total Procedure Duration: 0 hours 28 minutes 46 seconds  Findings:                 The digital rectal exam findings include                            hemorrhoids. Pertinent negatives include no                            palpable rectal lesions.                           13, sessile and semi-sessile polyps were found in                            the hepatic flexure and ascending colon. The polyps                            were 2 to 15 mm in size. These polyps were removed                            with a cold snare. Resection and retrieval were  complete.                           Normal mucosa was found in the entire colon.                           Non-bleeding non-thrombosed external and internal                            hemorrhoids were found during retroflexion, during                            perianal exam and during digital exam. The                            hemorrhoids were Grade II (internal hemorrhoids                            that prolapse but reduce spontaneously). Complications:            No immediate complications. Estimated Blood Loss:     Estimated blood loss was minimal. Impression:               - Hemorrhoids found on digital rectal exam.                           - 13, 2 to 15 mm polyps at the hepatic flexure and                            in the ascending colon, removed with a cold snare.                            Resected and  retrieved.                           - Normal mucosa in the entire examined colon.                           - Non-bleeding non-thrombosed external and internal                            hemorrhoids. Recommendation:           - The patient will be observed post-procedure,                            until all discharge criteria are met.                           - Discharge patient to home.                           - Patient has a contact number available for                            emergencies. The signs and symptoms of potential  delayed complications were discussed with the                            patient. Return to normal activities tomorrow.                            Written discharge instructions were provided to the                            patient.                           - High fiber diet.                           - Use FiberCon 1 tablet PO daily.                           - No aspirin, ibuprofen, naproxen, or other                            non-steroidal anti-inflammatory drugs for 2 weeks                            after polyp removal.                           - Await pathology results.                           - Repeat colonoscopy in 1 year for surveillance.                           - The findings and recommendations were discussed                            with the patient.                           - The findings and recommendations were discussed                            with the patient's family. Justice Britain, MD 10/03/2019 10:34:15 AM

## 2019-10-03 NOTE — Patient Instructions (Signed)
HANDOUTS PROVIDED ON:  POLYPS , HEMORRHOIDS, & HIGH FIBER DIET  THE POLYPS & BIOPSIES TAKEN TODAY HAVE BEEN SENT FOR PATHOLOGY.  THE RESULTS CAN TAKE 2-3 WEEKS TO RECEIVE.    A PRESCRIPTION FOR OMEPRAZOLE 40mg  TWICE A DAY FOR THE NEXT 3 MONTHS HAS BEEN SENT TO YOUR PHARMACY.  PLEASE AVOID ALL ASPIRIN, IBUPROFEN, NAPROXEN, OR OTHER NSAID FOR 2 WEEKS DUE TO POLYP REMOVAL.  YOU MAY RESUME YOUR PREVIOUS DIET AND MEDICATION SCHEDULE.  IT IS RECOMMENDED YOU INCREASE THE FIBER IN YOUR DIET.  YOU MAY ALSO TAKE FIBERCON (OTC) DAILY.  A REPEAT COLONOSCOPY IS RECOMMENDED IN 1 YEAR AND IT IS RECOMMENDED A REPEAT UPPER ENDOSCOPY OCCUR IN 2 MONTHS TO CHECK FOR HEALING.  [place] YOU FOR ALLOWING [place] TO CARE FOR YOU TODAY!!!  YOU HAD AN ENDOSCOPIC PROCEDURE TODAY AT [facility]:   Refer to the procedure report that was given to you for any specific questions about what was found during the examination.  If the procedure report does not answer your questions, please call your gastroenterologist to clarify.  If you requested that your care partner not be given the details of your procedure findings, then the procedure report has been included in a sealed envelope for you to review at your convenience later.  YOU SHOULD EXPECT: Some feelings of bloating in the abdomen. Passage of more gas than usual.  Walking can help get rid of the air that was put into your GI tract during the procedure and reduce the bloating. If you had a lower endoscopy (such as a colonoscopy or flexible sigmoidoscopy) you may notice spotting of blood in your stool or on the toilet paper. If you underwent a bowel prep for your procedure, you may not have a normal bowel movement for a few days.  Please Note:  You might notice some irritation and congestion in your nose or some drainage.  This is from the oxygen used during your procedure.  There is no need for concern and it should clear up in a day or so.  SYMPTOMS TO REPORT  IMMEDIATELY:   Following lower endoscopy (colonoscopy or flexible sigmoidoscopy):  Excessive amounts of blood in the stool  Significant tenderness or worsening of abdominal pains  Swelling of the abdomen that is new, acute  Fever of 100F or higher   Following upper endoscopy (EGD)  Vomiting of blood or coffee ground material  New chest pain or pain under the shoulder blades  Painful or persistently difficult swallowing  New shortness of breath  Fever of 100F or higher  Black, tarry-looking stools  For urgent or emergent issues, a gastroenterologist can be reached at any hour by calling [phone].   DIET:  We do recommend a small meal at first, but then you may proceed to your regular diet.  Drink plenty of fluids but you should avoid alcoholic beverages for 24 hours.  ACTIVITY:  You should plan to take it easy for the rest of today and you should NOT DRIVE or use heavy machinery until tomorrow (because of the sedation medicines used during the test).    FOLLOW UP: Our staff will call the number listed on your records 48-72 hours following your procedure to check on you and address any questions or concerns that you may have regarding the information given to you following your procedure. If we do not reach you, we will leave a message.  We will attempt to reach you two times.  During this  call, we will ask if you have developed any symptoms of COVID 19. If you develop any symptoms (ie: fever, flu-like symptoms, shortness of breath, cough etc.) before then, please call [phone].  If you test positive for Covid 19 in the 2 weeks post procedure, please call and report this information to [place].    If any biopsies were taken you will be contacted by phone or by letter within the next 1-3 weeks.  Please call us at [phone] if you have not heard about the biopsies in 3 weeks.    SIGNATURES/CONFIDENTIALITY: You and/or your care partner have signed paperwork which will be  entered into your electronic medical record.  These signatures attest to the fact that that the information above on your After Visit Summary has been reviewed and is understood.  Full responsibility of the confidentiality of this discharge information lies with you and/or your care-partner.

## 2019-10-05 ENCOUNTER — Telehealth: Payer: Self-pay | Admitting: *Deleted

## 2019-10-05 ENCOUNTER — Telehealth: Payer: Self-pay

## 2019-10-05 NOTE — Telephone Encounter (Signed)
NO ANSWER, MESSAGE LEFT FOR PATIENT. 

## 2019-10-05 NOTE — Telephone Encounter (Signed)
  Follow up Call-  Call back number 10/03/2019  Post procedure Call Back phone  # [phone]  Permission to leave phone message Yes    LMOM to call back with any questions or concerns.  Also, call back if patient has developed fever, respiratory issues or been dx with COVID or had any family members or close contacts diagnosed since her procedure.

## 2019-10-08 NOTE — Progress Notes (Signed)
Virtual Visit via Video Note  I connected with [name] on 10/08/19 at  4:15 PM EST by a video enabled telemedicine application and verified that I am speaking with the correct person using two identifiers.  Location: Patient: Patient is an home setting alone Provider: Office setting  I discussed the limitations of evaluation and management by telemedicine and the availability of in person appointments. The patient expressed understanding and agreed to proceed.  History of Present Illness: 63 year old female who was having back pain.  Has responded fairly well to epidurals and recently had nerve root injection in October.  States that this is the best she has felt better and feels like it is starting to worsen again slowly.  Patient is concerned that it will worsen again and patient was wondering if she can have another injection.    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  We discussed with patient about 4 being the number of injections I would encourage being the maximum within a 27-month period.  Patient will continue with conservative therapy otherwise.  Epidural ordered for her.  Patient wants to avoid surgical intervention.  Follow-up again in 4 to 6 weeks   Follow Up Instructions: 4 to 6 weeks    I discussed the assessment and treatment plan with the patient. The patient was provided an opportunity to ask questions and all were answered. The patient agreed with the plan and demonstrated an understanding of the instructions.   The patient was advised to call back or seek an in-person evaluation if the symptoms worsen or if the condition fails to improve as anticipated.  I provided 16 minutes of ace-to-face time during this encounter.   Lyndal Pulley, DO

## 2019-10-09 ENCOUNTER — Encounter: Payer: Self-pay | Admitting: Gastroenterology

## 2019-10-10 ENCOUNTER — Ambulatory Visit (INDEPENDENT_AMBULATORY_CARE_PROVIDER_SITE_OTHER): Payer: BC Managed Care – PPO | Admitting: Family Medicine

## 2019-10-10 ENCOUNTER — Encounter: Payer: Self-pay | Admitting: Family Medicine

## 2019-10-10 ENCOUNTER — Encounter: Payer: Self-pay | Admitting: Gastroenterology

## 2019-10-10 DIAGNOSIS — M5416 Radiculopathy, lumbar region: Secondary | ICD-10-CM

## 2019-10-23 DIAGNOSIS — L570 Actinic keratosis: Secondary | ICD-10-CM | POA: Diagnosis not present

## 2019-10-23 DIAGNOSIS — D485 Neoplasm of uncertain behavior of skin: Secondary | ICD-10-CM | POA: Diagnosis not present

## 2019-10-30 ENCOUNTER — Encounter: Payer: Self-pay | Admitting: Family

## 2019-11-02 DIAGNOSIS — Z20828 Contact with and (suspected) exposure to other viral communicable diseases: Secondary | ICD-10-CM | POA: Diagnosis not present

## 2019-11-15 ENCOUNTER — Encounter: Payer: Self-pay | Admitting: Gastroenterology

## 2019-11-15 ENCOUNTER — Ambulatory Visit (AMBULATORY_SURGERY_CENTER): Payer: BC Managed Care – PPO | Admitting: *Deleted

## 2019-11-15 VITALS — Ht 67.0 in | Wt 170.0 lb

## 2019-11-15 DIAGNOSIS — K259 Gastric ulcer, unspecified as acute or chronic, without hemorrhage or perforation: Secondary | ICD-10-CM

## 2019-11-15 DIAGNOSIS — Z1159 Encounter for screening for other viral diseases: Secondary | ICD-10-CM

## 2019-11-15 NOTE — Progress Notes (Signed)

## 2019-11-16 ENCOUNTER — Other Ambulatory Visit: Payer: Self-pay

## 2019-11-17 ENCOUNTER — Inpatient Hospital Stay
Admission: RE | Admit: 2019-11-17 | Discharge: 2019-11-17 | Disposition: A | Discharge status: Home / Self Care | Payer: BC Managed Care – PPO | Source: Ambulatory Visit | Attending: Family Medicine | Admitting: Family Medicine

## 2019-11-17 NOTE — Discharge Instructions (Signed)

## 2019-11-20 ENCOUNTER — Ambulatory Visit: Payer: BC Managed Care – PPO | Admitting: Internal Medicine

## 2019-11-21 ENCOUNTER — Other Ambulatory Visit: Payer: Self-pay

## 2019-11-21 ENCOUNTER — Encounter: Payer: Self-pay | Admitting: Internal Medicine

## 2019-11-21 ENCOUNTER — Ambulatory Visit
Admission: RE | Admit: 2019-11-21 | Discharge: 2019-11-21 | Disposition: A | Discharge status: Home / Self Care | Payer: BC Managed Care – PPO | Source: Ambulatory Visit | Attending: Family Medicine | Admitting: Family Medicine

## 2019-11-21 DIAGNOSIS — M5416 Radiculopathy, lumbar region: Secondary | ICD-10-CM | POA: Diagnosis not present

## 2019-11-21 MED ORDER — IOPAMIDOL (ISOVUE-M 200) INJECTION 41%
1.0000 mL | Freq: Once | INTRAMUSCULAR | Status: AC
  Administered 2019-11-21: 09:00:00 1 mL via EPIDURAL

## 2019-11-21 MED ORDER — THYROID 60 MG PO TABS: 60.0000 mg | ORAL_TABLET | Freq: Every day | ORAL | 0 refills | Status: DC

## 2019-11-21 MED ORDER — METHYLPREDNISOLONE ACETATE 40 MG/ML INJ SUSP (RADIOLOG
120.0000 mg | Freq: Once | INTRAMUSCULAR | Status: AC
  Administered 2019-11-21: 09:00:00 120 mg via EPIDURAL

## 2019-11-21 NOTE — Discharge Instructions (Signed)

## 2019-11-22 ENCOUNTER — Encounter: Payer: Self-pay | Admitting: Internal Medicine

## 2019-11-22 ENCOUNTER — Ambulatory Visit (INDEPENDENT_AMBULATORY_CARE_PROVIDER_SITE_OTHER): Payer: BC Managed Care – PPO | Admitting: Internal Medicine

## 2019-11-22 ENCOUNTER — Other Ambulatory Visit: Payer: Self-pay | Admitting: Gastroenterology

## 2019-11-22 ENCOUNTER — Ambulatory Visit (INDEPENDENT_AMBULATORY_CARE_PROVIDER_SITE_OTHER): Payer: BC Managed Care – PPO

## 2019-11-22 VITALS — BP 104/64 | HR 78 | Temp 97.6°F | Ht 67.0 in | Wt 181.4 lb

## 2019-11-22 DIAGNOSIS — Z1159 Encounter for screening for other viral diseases: Secondary | ICD-10-CM

## 2019-11-22 DIAGNOSIS — E039 Hypothyroidism, unspecified: Secondary | ICD-10-CM

## 2019-11-22 NOTE — Patient Instructions (Signed)
-   Please remember to hold "Biotin" or any supplements that is good for "hair, skin and nails" for 48-72 hours prior to any thyroid checks.

## 2019-11-22 NOTE — Progress Notes (Signed)
Name: [name]  MRN/ DOB: [id number], 31-Oct-1956    Age/ Sex: 63 y.o., female     PCP: Marrian Salvage, [place]   Reason for Endocrinology Evaluation: Hypothyroidism     Initial Endocrinology Clinic Visit: 08/22/2019    PATIENT IDENTIFIER: [name] is a 63 y.o., female with a past medical history of Hypothyroidism. She has followed with [facility] since 08/22/2019 for consultative assistance with management of her Hypothyroidism.   HISTORICAL SUMMARY: The patient was first diagnosed with hypothyroidism in ~ 2005. Has  always been on dissicated thyroid . Has never been on levothyroxine.   She was started on HRT ~ 12 yrs ago but stopped in the summer of 2020 due to vaginal bleed. This has been evaluated by GYN with serial ultrasound per pt and no pathology found. Bleeding resolved with discontinuation of HRT.     She is a pastor  SUBJECTIVE:   During last visit (08/22/2019): TSH 0.2 uIU/mL. She was advised to reduce armour thyroid 60 mg to 6 days a week  Today (11/22/2019):  [name] is here for a follow up on hypothyroidism. She has been taking armour thyroid 6 days a week (skipping sundays) as prescribed. She was recently started on PPI and having issues managing all her medications and separating them appropriately.  Denies constipation Has fatigue that she attributes to extra work hours.      ROS:  As per HPI.   HISTORY:  Past Medical History:  Past Medical History:  Diagnosis Date  . Anemia   . Gout   . Hyperlipidemia   . Hypothyroidism   . Lumbar stenosis 2020  . Osteoarthritis    lower back   Past Surgical History:  Past Surgical History:  Procedure Laterality Date  . NO PAST SURGERIES      Social History:  reports that she has never smoked. She has never used smokeless tobacco. She reports that she does not drink alcohol or use drugs. Family History:  Family History  Problem Relation Age of Onset  . Breast cancer  Sister   . Rheum arthritis Mother   . Colon polyps Mother   . COPD Father   . Colon polyps Father   . Colon cancer Paternal Aunt   . Esophageal cancer Neg Hx   . Inflammatory bowel disease Neg Hx   . Pancreatic cancer Neg Hx   . Liver disease Neg Hx   . Stomach cancer Neg Hx   . Rectal cancer Neg Hx      HOME MEDICATIONS: Allergies as of 11/22/2019      Reactions   Penicillins Anaphylaxis   Statins       Medication List       Accurate as of November 22, 2019  9:36 AM. If you have any questions, ask your nurse or doctor.        allopurinol 300 MG tablet Commonly known as: ZYLOPRIM Take 1 tablet (300 mg total) by mouth daily.   amitriptyline 25 MG tablet Commonly known as: ELAVIL Take 1 tablet (25 mg total) by mouth at bedtime.   amitriptyline 10 MG tablet Commonly known as: ELAVIL Take 10 mg by mouth at bedtime.   ferrous sulfate 325 (65 FE) MG tablet Commonly known as: FerrouSul Take 1 tablet (325 mg total) by mouth daily with breakfast.   gabapentin 100 MG capsule Commonly known as: NEURONTIN Take 2 capsules (200 mg total) by mouth 3 (three) times daily.   omeprazole 40 MG  capsule Commonly known as: PRILOSEC Take 1 capsule (40 mg total) by mouth 2 (two) times daily.   thyroid 60 MG tablet Commonly known as: ARMOUR Take 1 tablet (60 mg total) by mouth daily.   Vitamin D (Ergocalciferol) 1.25 MG (50000 UT) Caps capsule Commonly known as: DRISDOL Take 50,000 Units by mouth once a week.         OBJECTIVE:   PHYSICAL EXAM: VS: BP 104/64 (BP Location: Left Arm, Patient Position: Sitting, Cuff Size: Large)   Pulse 78   Temp 97.6 F (36.4 C)   Ht 5\' 7"  (1.702 m)   Wt 181 lb 6.4 oz (82.3 kg)   SpO2 96%   BMI 28.41 kg/m    EXAM: General: Pt appears well and is in NAD  Neck: General: Supple without adenopathy. Thyroid: Thyroid size normal.  No goiter or nodules appreciated. No thyroid bruit.  Lungs: Clear with good BS bilat with no rales,  rhonchi, or wheezes  Heart: Auscultation: RRR.  Abdomen: Normoactive bowel sounds, soft, nontender, without masses or organomegaly palpable     DATA REVIEWED: Results for [name], [name] (MRN [id number]) as of 11/22/2019 09:37  Ref. Range 09/08/2019 10:03  TSH Latest Ref Range: 0.35 - 4.50 uIU/mL 0.22 (L)  T4,Free(Direct) Latest Ref Range: 0.60 - 1.60 ng/dL 0.74      ASSESSMENT / PLAN / RECOMMENDATIONS:   1. Hypothyroidism:    - Clinically euthyroid  - No local neck symptoms.  - Pt would like to come back next week for repeat labs, she was reminded to hold Biotin 48 hrs prior to next TFT checks.  - Her regimen as become more complicated with armour thyroid, iron and PPI intake.      Medications   Armour thyroid 60 mg 6x a week    F/U in 6 months   Signed electronically by: Mack Guise, MD  [facility]  [facility] [address] [place], [place] [address] Phone: [phone] FAX: [phone]      CC: Marrian Salvage, [address] [place] [address] Phone: [phone]  Fax: [phone]   Return to Endocrinology clinic as below: Future Appointments  Date Time Provider [place]  11/22/2019 12:10 PM [facility] PREVISIT RM 68 [facility]  11/28/2019  1:30 PM Mansouraty, Telford Nab., MD [facility]

## 2019-11-23 LAB — SARS CORONAVIRUS 2 (TAT 6-24 HRS): SARS Coronavirus 2: NEGATIVE

## 2019-11-27 ENCOUNTER — Other Ambulatory Visit (INDEPENDENT_AMBULATORY_CARE_PROVIDER_SITE_OTHER): Payer: BC Managed Care – PPO

## 2019-11-27 ENCOUNTER — Other Ambulatory Visit: Payer: Self-pay

## 2019-11-27 DIAGNOSIS — E039 Hypothyroidism, unspecified: Secondary | ICD-10-CM | POA: Diagnosis not present

## 2019-11-27 LAB — T4, FREE: Free T4: 0.65 ng/dL (ref 0.60–1.60)

## 2019-11-27 LAB — TSH: TSH: 0.88 u[IU]/mL (ref 0.35–4.50)

## 2019-11-28 ENCOUNTER — Ambulatory Visit (AMBULATORY_SURGERY_CENTER): Payer: BC Managed Care – PPO | Admitting: Gastroenterology

## 2019-11-28 ENCOUNTER — Encounter: Payer: Self-pay | Admitting: Gastroenterology

## 2019-11-28 VITALS — BP 121/88 | HR 63 | Temp 97.7°F | Resp 9 | Ht 67.0 in | Wt 181.0 lb

## 2019-11-28 DIAGNOSIS — K219 Gastro-esophageal reflux disease without esophagitis: Secondary | ICD-10-CM | POA: Diagnosis not present

## 2019-11-28 DIAGNOSIS — K2950 Unspecified chronic gastritis without bleeding: Secondary | ICD-10-CM | POA: Diagnosis not present

## 2019-11-28 DIAGNOSIS — K2970 Gastritis, unspecified, without bleeding: Secondary | ICD-10-CM

## 2019-11-28 DIAGNOSIS — K259 Gastric ulcer, unspecified as acute or chronic, without hemorrhage or perforation: Secondary | ICD-10-CM | POA: Diagnosis not present

## 2019-11-28 DIAGNOSIS — K317 Polyp of stomach and duodenum: Secondary | ICD-10-CM

## 2019-11-28 DIAGNOSIS — K2990 Gastroduodenitis, unspecified, without bleeding: Secondary | ICD-10-CM

## 2019-11-28 MED ORDER — SODIUM CHLORIDE 0.9 % IV SOLN: 500.0000 mL | Freq: Once | INTRAVENOUS | Status: DC

## 2019-11-28 NOTE — Op Note (Addendum)
[place] Patient Name: [name] Procedure Date: 11/28/2019 12:56 PM MRN: [id number] Endoscopist: Justice Britain , MD Age: 63 Referring MD:  Date of Birth: 1956/05/19 Gender: Female Account #: [id number] Procedure:                Upper GI endoscopy Indications:              Follow-up of gastric ulcer, Follow-up of gastritis Medicines:                Monitored Anesthesia Care Procedure:                Pre-Anesthesia Assessment:                           - Prior to the procedure, a History and Physical                            was performed, and patient medications and                            allergies were reviewed. The patient's tolerance of                            previous anesthesia was also reviewed. The risks                            and benefits of the procedure and the sedation                            options and risks were discussed with the patient.                            All questions were answered, and informed consent                            was obtained. Prior Anticoagulants: The patient has                            taken no previous anticoagulant or antiplatelet                            agents. ASA Grade Assessment: II - A patient with                            mild systemic disease. After reviewing the risks                            and benefits, the patient was deemed in                            satisfactory condition to undergo the procedure.                           After obtaining informed consent, the endoscope was  passed under direct vision. Throughout the                            procedure, the patient's blood pressure, pulse, and                            oxygen saturations were monitored continuously. The                            Endoscope was introduced through the mouth, and                            advanced to the second part of duodenum. The upper      GI endoscopy was accomplished without difficulty.                            The patient tolerated the procedure. Scope In: Scope Out: Findings:                 No gross lesions were noted in the entire                            esophagus. Previous esophagitis has healed.                           The Z-line was regular and was found 40 cm from the                            incisors.                           Patchy mildly erythematous mucosa without bleeding                            was found in the gastric body.                           One non-bleeding superficial gastric ulcer with no                            stigmata of bleeding was found on the anterior wall                            of the gastric antrum. The lesion was 5 mm in                            largest dimension. This is healing from previous                            ulceration.                           Surrounding this lesion was localized moderate  mucosal changes characterized by congestion,                            erythema and altered texture were found on the                            anterior wall of the gastric antrum. Biopsies were                            taken with a cold forceps for histology to rule out                            dysplasia, though suspect this is inflammatory                            driven from recent ulcer rather than adenomatous.                           No other gross lesions were noted in the entire                            examined stomach. Biopsies were taken with a cold                            forceps for histology and Helicobacter pylori                            testing.                           No gross lesions were noted in the duodenal bulb,                            in the first portion of the duodenum and in the                            second portion of the duodenum. Complications:            No immediate  complications. Estimated Blood Loss:     Estimated blood loss was minimal. Impression:               - No gross lesions in esophagus. Z-line regular, 40                            cm from the incisors. Previous esophagitis has                            healed.                           - Erythematous mucosa in the gastric body.                            Non-bleeding gastric ulcer with no stigmata of  bleeding - healing from previous endoscopy.                            Surrounding the ulceration was congested,                            erythematous and texture changed mucosa in the                            anterior wall of the gastric antrum. Biopsied to                            rule out dysplasia. HP biopsies obtained as well                            randomly from stomach.                           - No gross lesions in the duodenal bulb, in the                            first portion of the duodenum and in the second                            portion of the duodenum. Recommendation:           - The patient will be observed post-procedure,                            until all discharge criteria are met.                           - Discharge patient to home.                           - Patient has a contact number available for                            emergencies. The signs and symptoms of potential                            delayed complications were discussed with the                            patient. Return to normal activities tomorrow.                            Written discharge instructions were provided to the                            patient.                           - Resume previous diet.                           -  Continue present medications. Continue PPI BID                            through January and then in February start once                            daily PPI until next EGD.                           - Await pathology  results. Unlikely to be                            dysplastic, but if evidence of adenomatous tissue,                            will need to consider EMR attempt in the                            hospital-based setting.                           - Repeat upper endoscopy in 3 months to check                            healing.                           - The findings and recommendations were discussed                            with the patient. Justice Britain, MD 11/28/2019 1:25:36 PM

## 2019-11-28 NOTE — Patient Instructions (Signed)
Continue PPI twice a day for one month, then once daily.  YOU HAD AN ENDOSCOPIC PROCEDURE TODAY AT [facility]:   Refer to the procedure report that was given to you for any specific questions about what was found during the examination.  If the procedure report does not answer your questions, please call your gastroenterologist to clarify.  If you requested that your care partner not be given the details of your procedure findings, then the procedure report has been included in a sealed envelope for you to review at your convenience later.  YOU SHOULD EXPECT: Some feelings of bloating in the abdomen. Passage of more gas than usual.  Walking can help get rid of the air that was put into your GI tract during the procedure and reduce the bloating. If you had a lower endoscopy (such as a colonoscopy or flexible sigmoidoscopy) you may notice spotting of blood in your stool or on the toilet paper. If you underwent a bowel prep for your procedure, you may not have a normal bowel movement for a few days.  Please Note:  You might notice some irritation and congestion in your nose or some drainage.  This is from the oxygen used during your procedure.  There is no need for concern and it should clear up in a day or so.  SYMPTOMS TO REPORT IMMEDIATELY:    Following upper endoscopy (EGD)  Vomiting of blood or coffee ground material  New chest pain or pain under the shoulder blades  Painful or persistently difficult swallowing  New shortness of breath  Fever of 100F or higher  Black, tarry-looking stools  For urgent or emergent issues, a gastroenterologist can be reached at any hour by calling [phone].   DIET:  We do recommend a small meal at first, but then you may proceed to your regular diet.  Drink plenty of fluids but you should avoid alcoholic beverages for 24 hours.  ACTIVITY:  You should plan to take it easy for the rest of today and you should NOT DRIVE or use heavy  machinery until tomorrow (because of the sedation medicines used during the test).    FOLLOW UP: Our staff will call the number listed on your records 48-72 hours following your procedure to check on you and address any questions or concerns that you may have regarding the information given to you following your procedure. If we do not reach you, we will leave a message.  We will attempt to reach you two times.  During this call, we will ask if you have developed any symptoms of COVID 19. If you develop any symptoms (ie: fever, flu-like symptoms, shortness of breath, cough etc.) before then, please call [phone].  If you test positive for Covid 19 in the 2 weeks post procedure, please call and report this information to [place].    If any biopsies were taken you will be contacted by phone or by letter within the next 1-3 weeks.  Please call us at [phone] if you have not heard about the biopsies in 3 weeks.    SIGNATURES/CONFIDENTIALITY: You and/or your care partner have signed paperwork which will be entered into your electronic medical record.  These signatures attest to the fact that that the information above on your After Visit Summary has been reviewed and is understood.  Full responsibility of the confidentiality of this discharge information lies with you and/or your care-partner.

## 2019-11-28 NOTE — Progress Notes (Signed)
Report to PACU, RN, vss, BBS= Clear.  

## 2019-11-28 NOTE — Progress Notes (Signed)
Attempted to make follow-up 3 month EGD appointment. Schedule not available that far. Patient made aware that she needs to call to schedule follow-up EGD.

## 2019-11-28 NOTE — Progress Notes (Signed)
Called to room to assist during endoscopic procedure.  Patient ID and intended procedure confirmed with present staff. Received instructions for my participation in the procedure from the performing physician.  

## 2019-11-28 NOTE — Progress Notes (Signed)
Temp by JB, Vitals by CW

## 2019-11-30 ENCOUNTER — Telehealth: Payer: Self-pay | Admitting: *Deleted

## 2019-11-30 NOTE — Telephone Encounter (Signed)
Follow up call made, left message. 

## 2019-12-05 ENCOUNTER — Encounter: Payer: Self-pay | Admitting: Gastroenterology

## 2019-12-14 ENCOUNTER — Other Ambulatory Visit: Payer: Self-pay | Admitting: Family Medicine

## 2019-12-25 ENCOUNTER — Other Ambulatory Visit: Payer: Self-pay | Admitting: Internal Medicine

## 2019-12-27 DIAGNOSIS — M9902 Segmental and somatic dysfunction of thoracic region: Secondary | ICD-10-CM | POA: Diagnosis not present

## 2019-12-27 DIAGNOSIS — M9901 Segmental and somatic dysfunction of cervical region: Secondary | ICD-10-CM | POA: Diagnosis not present

## 2019-12-27 DIAGNOSIS — M47892 Other spondylosis, cervical region: Secondary | ICD-10-CM | POA: Diagnosis not present

## 2019-12-27 DIAGNOSIS — M9903 Segmental and somatic dysfunction of lumbar region: Secondary | ICD-10-CM | POA: Diagnosis not present

## 2020-01-03 ENCOUNTER — Other Ambulatory Visit: Payer: Self-pay | Admitting: Gastroenterology

## 2020-01-16 ENCOUNTER — Encounter: Payer: Self-pay | Admitting: Family

## 2020-01-16 ENCOUNTER — Other Ambulatory Visit: Payer: Self-pay | Admitting: Family

## 2020-01-16 MED ORDER — ALLOPURINOL 300 MG PO TABS: 300.0000 mg | ORAL_TABLET | Freq: Every day | ORAL | 3 refills | Status: DC

## 2020-01-18 NOTE — Telephone Encounter (Signed)
Patty, Let's plan to obtain a CBC/Iron/TIBC/Ferritin as future orders in the next 1-2 weeks. Also please send new prescription of Iron for patient. Thank you. GM

## 2020-01-20 ENCOUNTER — Other Ambulatory Visit: Payer: Self-pay | Admitting: Gastroenterology

## 2020-01-23 DIAGNOSIS — M9901 Segmental and somatic dysfunction of cervical region: Secondary | ICD-10-CM | POA: Diagnosis not present

## 2020-01-23 DIAGNOSIS — M9902 Segmental and somatic dysfunction of thoracic region: Secondary | ICD-10-CM | POA: Diagnosis not present

## 2020-01-23 DIAGNOSIS — M47892 Other spondylosis, cervical region: Secondary | ICD-10-CM | POA: Diagnosis not present

## 2020-01-23 DIAGNOSIS — M9903 Segmental and somatic dysfunction of lumbar region: Secondary | ICD-10-CM | POA: Diagnosis not present

## 2020-02-06 ENCOUNTER — Other Ambulatory Visit: Payer: Self-pay | Admitting: Family Medicine

## 2020-02-09 ENCOUNTER — Ambulatory Visit: Payer: BC Managed Care – PPO | Attending: Internal Medicine

## 2020-02-09 DIAGNOSIS — Z23 Encounter for immunization: Secondary | ICD-10-CM

## 2020-02-09 NOTE — Progress Notes (Signed)
   Covid-19 Vaccination Clinic  Name:  [name]    MRN: [id number] DOB: February 11, 1956  02/09/2020  [name] was observed post Covid-19 immunization for 15 minutes without incident. She was provided with Vaccine Information Sheet and instruction to access the V-Safe system.   [name] was instructed to call 911 with any severe reactions post vaccine: Marland Kitchen Difficulty breathing  . Swelling of face and throat  . A fast heartbeat  . A bad rash all over body  . Dizziness and weakness   Immunizations Administered    Name Date Dose VIS Date Route   Pfizer COVID-19 Vaccine 02/09/2020  1:08 PM 0.3 mL 11/10/2019 Intramuscular   Manufacturer: [place]   Lot: [id number]   [place]: [id number]

## 2020-02-20 DIAGNOSIS — M9901 Segmental and somatic dysfunction of cervical region: Secondary | ICD-10-CM | POA: Diagnosis not present

## 2020-02-20 DIAGNOSIS — M9903 Segmental and somatic dysfunction of lumbar region: Secondary | ICD-10-CM | POA: Diagnosis not present

## 2020-02-20 DIAGNOSIS — M9902 Segmental and somatic dysfunction of thoracic region: Secondary | ICD-10-CM | POA: Diagnosis not present

## 2020-02-20 DIAGNOSIS — M47892 Other spondylosis, cervical region: Secondary | ICD-10-CM | POA: Diagnosis not present

## 2020-02-22 ENCOUNTER — Encounter: Payer: Self-pay | Admitting: Family

## 2020-02-28 ENCOUNTER — Encounter: Payer: Self-pay | Admitting: Family Medicine

## 2020-02-28 ENCOUNTER — Other Ambulatory Visit: Payer: Self-pay

## 2020-02-28 MED ORDER — GABAPENTIN 100 MG PO CAPS: 200.0000 mg | ORAL_CAPSULE | Freq: Three times a day (TID) | ORAL | 3 refills | Status: DC

## 2020-02-28 MED ORDER — VITAMIN D (ERGOCALCIFEROL) 1.25 MG (50000 UNIT) PO CAPS: 50000.0000 [IU] | ORAL_CAPSULE | ORAL | 0 refills | Status: DC

## 2020-03-01 DIAGNOSIS — M47892 Other spondylosis, cervical region: Secondary | ICD-10-CM | POA: Diagnosis not present

## 2020-03-01 DIAGNOSIS — M9901 Segmental and somatic dysfunction of cervical region: Secondary | ICD-10-CM | POA: Diagnosis not present

## 2020-03-01 DIAGNOSIS — M9903 Segmental and somatic dysfunction of lumbar region: Secondary | ICD-10-CM | POA: Diagnosis not present

## 2020-03-01 DIAGNOSIS — M9902 Segmental and somatic dysfunction of thoracic region: Secondary | ICD-10-CM | POA: Diagnosis not present

## 2020-03-03 ENCOUNTER — Other Ambulatory Visit: Payer: Self-pay | Admitting: Family

## 2020-03-04 ENCOUNTER — Other Ambulatory Visit: Payer: Self-pay

## 2020-03-04 ENCOUNTER — Ambulatory Visit: Payer: BC Managed Care – PPO | Attending: Internal Medicine

## 2020-03-04 DIAGNOSIS — Z23 Encounter for immunization: Secondary | ICD-10-CM

## 2020-03-04 MED ORDER — OMEPRAZOLE 40 MG PO CPDR: 40.0000 mg | DELAYED_RELEASE_CAPSULE | Freq: Two times a day (BID) | ORAL | 1 refills | Status: DC

## 2020-03-04 MED ORDER — FERROUS SULFATE 325 (65 FE) MG PO TABS: 325.0000 mg | ORAL_TABLET | Freq: Every day | ORAL | 1 refills | Status: DC

## 2020-03-04 NOTE — Progress Notes (Signed)
   Covid-19 Vaccination Clinic  Name:  Evalina Creed    MRN: [id number] DOB: May 24, 1956  03/04/2020  Ms. Louth was observed post Covid-19 immunization for 15 minutes without incident. She was provided with Vaccine Information Sheet and instruction to access the V-Safe system.   Ms. Partsch was instructed to call 911 with any severe reactions post vaccine: Marland Kitchen Difficulty breathing  . Swelling of face and throat  . A fast heartbeat  . A bad rash all over body  . Dizziness and weakness   Immunizations Administered    Name Date Dose VIS Date Route   Pfizer COVID-19 Vaccine 03/04/2020  8:26 AM 0.3 mL 11/10/2019 Intramuscular   Manufacturer: [place]   Lot: [id number]   [place]: [id number]

## 2020-03-05 ENCOUNTER — Other Ambulatory Visit: Payer: Self-pay | Admitting: Gastroenterology

## 2020-03-19 ENCOUNTER — Ambulatory Visit: Payer: BC Managed Care – PPO | Admitting: Family

## 2020-03-20 ENCOUNTER — Ambulatory Visit (INDEPENDENT_AMBULATORY_CARE_PROVIDER_SITE_OTHER): Payer: BC Managed Care – PPO | Admitting: Family

## 2020-03-20 ENCOUNTER — Other Ambulatory Visit: Payer: Self-pay

## 2020-03-20 ENCOUNTER — Encounter: Payer: Self-pay | Admitting: Family

## 2020-03-20 VITALS — BP 118/82 | HR 72 | Temp 98.2°F | Wt 186.0 lb

## 2020-03-20 DIAGNOSIS — Z0000 Encounter for general adult medical examination without abnormal findings: Secondary | ICD-10-CM | POA: Diagnosis not present

## 2020-03-20 DIAGNOSIS — M5416 Radiculopathy, lumbar region: Secondary | ICD-10-CM

## 2020-03-20 DIAGNOSIS — E039 Hypothyroidism, unspecified: Secondary | ICD-10-CM

## 2020-03-20 DIAGNOSIS — E559 Vitamin D deficiency, unspecified: Secondary | ICD-10-CM

## 2020-03-20 DIAGNOSIS — E785 Hyperlipidemia, unspecified: Secondary | ICD-10-CM

## 2020-03-20 DIAGNOSIS — M109 Gout, unspecified: Secondary | ICD-10-CM

## 2020-03-20 MED ORDER — GABAPENTIN 100 MG PO CAPS: 200.0000 mg | ORAL_CAPSULE | Freq: Three times a day (TID) | ORAL | 3 refills | Status: AC

## 2020-03-20 MED ORDER — AMITRIPTYLINE HCL 25 MG PO TABS: 25.0000 mg | ORAL_TABLET | Freq: Every day | ORAL | 3 refills | Status: DC

## 2020-03-20 MED ORDER — ALLOPURINOL 300 MG PO TABS: 300.0000 mg | ORAL_TABLET | Freq: Every day | ORAL | 3 refills | Status: AC

## 2020-03-20 MED ORDER — VITAMIN D (ERGOCALCIFEROL) 1.25 MG (50000 UNIT) PO CAPS: 50000.0000 [IU] | ORAL_CAPSULE | ORAL | 0 refills | Status: AC

## 2020-03-20 MED ORDER — OMEPRAZOLE 40 MG PO CPDR: 40.0000 mg | DELAYED_RELEASE_CAPSULE | Freq: Two times a day (BID) | ORAL | 3 refills | Status: AC

## 2020-03-20 MED ORDER — THYROID 60 MG PO TABS: 60.0000 mg | ORAL_TABLET | Freq: Every day | ORAL | 3 refills | Status: DC

## 2020-03-20 MED ORDER — FERROUS SULFATE 325 (65 FE) MG PO TABS: 325.0000 mg | ORAL_TABLET | Freq: Every day | ORAL | 3 refills | Status: AC

## 2020-03-20 NOTE — Progress Notes (Signed)
[name] is a 64 y.o. female with the following history as recorded in EpicCare:  Patient Active Problem List   Diagnosis Date Noted  . Squamous cell cancer of skin of right hand 06/26/2019  . Left lumbar radiculopathy 01/03/2019  . Gout 08/30/2018  . Fibromyalgia 08/30/2018  . Hypothyroidism 08/30/2018  . Herpes infection 06/09/2017    Current Outpatient Medications  Medication Sig Dispense Refill  . allopurinol (ZYLOPRIM) 300 MG tablet Take 1 tablet (300 mg total) by mouth daily. 90 tablet 3  . amitriptyline (ELAVIL) 25 MG tablet Take 1 tablet (25 mg total) by mouth at bedtime. 90 tablet 3  . ferrous sulfate 325 (65 FE) MG tablet Take 1 tablet (325 mg total) by mouth daily with breakfast. 90 tablet 3  . gabapentin (NEURONTIN) 100 MG capsule Take 2 capsules (200 mg total) by mouth 3 (three) times daily. 180 capsule 3  . omeprazole (PRILOSEC) 40 MG capsule Take 1 capsule (40 mg total) by mouth 2 (two) times daily. 180 capsule 3  . thyroid (NP THYROID) 60 MG tablet Take 1 tablet (60 mg total) by mouth daily. 90 tablet 3  . Vitamin D, Ergocalciferol, (DRISDOL) 1.25 MG (50000 UNIT) CAPS capsule Take 1 capsule (50,000 Units total) by mouth once a week. 12 capsule 0   No current facility-administered medications for this visit.    Allergies: Penicillins and Statins  Past Medical History:  Diagnosis Date  . Anemia   . Gout   . Hyperlipidemia   . Hypothyroidism   . Lumbar stenosis 2020  . Osteoarthritis    lower back    Past Surgical History:  Procedure Laterality Date  . NO PAST SURGERIES      Family History  Problem Relation Age of Onset  . Breast cancer Sister   . Rheum arthritis Mother   . Colon polyps Mother   . COPD Father   . Colon polyps Father   . Colon cancer Paternal Aunt   . Esophageal cancer Neg Hx   . Inflammatory bowel disease Neg Hx   . Pancreatic cancer Neg Hx   . Liver disease Neg Hx   . Stomach cancer Neg Hx   . Rectal cancer Neg Hx      Social History   Tobacco Use  . Smoking status: Never Smoker  . Smokeless tobacco: Never Used  Substance Use Topics  . Alcohol use: Never    Subjective:  Patient presents for yearly CPE; will be moving back to [place] next week- realized she needed to be closer to her family;  Is frustrated by weight gain- admits has been eating more; not able to exercise as regularly due to COVID restrictions; Back issues have been more problematic in the past year as well;  Up to date on GYN, GI needs;  Does not want to update labs today;   Review of Systems  Constitutional: Negative.   HENT: Negative.   Eyes: Negative.   Respiratory: Negative.   Cardiovascular: Negative.   Gastrointestinal: Positive for heartburn.  Genitourinary: Negative.   Musculoskeletal: Positive for back pain.  Skin: Negative.   Neurological: Negative.   Endo/Heme/Allergies: Negative.   Psychiatric/Behavioral: Positive for depression.      Objective:  Vitals:   03/20/20 0913  BP: 118/82  Pulse: 72  Temp: 98.2 F (36.8 C)  TempSrc: Oral  SpO2: 97%  Weight: 186 lb (84.4 kg)    General: Well developed, well nourished, in no acute distress  Skin : Warm and dry.  Head: Normocephalic and atraumatic  Eyes: Sclera and conjunctiva clear; pupils round and reactive to light; extraocular movements intact  Ears: External normal; canals clear; tympanic membranes normal  Oropharynx: Pink, supple. No suspicious lesions  Neck: Supple without thyromegaly, adenopathy  Lungs: Respirations unlabored; clear to auscultation bilaterally without wheeze, rales, rhonchi  CVS exam: normal rate and regular rhythm.  Abdomen: Soft; nontender; nondistended; normoactive bowel sounds; no masses or hepatosplenomegaly  Musculoskeletal: No deformities; no active joint inflammation  Extremities: No edema, cyanosis, clubbing  Vessels: Symmetric bilaterally  Neurologic: Alert and oriented; speech intact; face symmetrical; moves all  extremities well; CNII-XII intact without focal deficit   Assessment:  1. PE (physical exam), annual   2. Hypothyroidism, unspecified type   3. Gout of foot, unspecified cause, unspecified chronicity, unspecified laterality   4. Lumbar radiculopathy   5. Vitamin D deficiency   6. Hyperlipidemia, unspecified hyperlipidemia type     Plan:  Patient is in the process of moving to [place]; requesting updated prescriptions- does not want to update labs today; she will establish with new providers in [place]; She understands she can reach out if she needs anything.  This visit occurred during the SARS-CoV-2 public health emergency.  Safety protocols were in place, including screening questions prior to the visit, additional usage of staff PPE, and extensive cleaning of exam room while observing appropriate contact time as indicated for disinfecting solutions.     No follow-ups on file.  No orders of the defined types were placed in this encounter.   Requested Prescriptions   Signed Prescriptions Disp Refills  . allopurinol (ZYLOPRIM) 300 MG tablet 90 tablet 3    Sig: Take 1 tablet (300 mg total) by mouth daily.  Marland Kitchen amitriptyline (ELAVIL) 25 MG tablet 90 tablet 3    Sig: Take 1 tablet (25 mg total) by mouth at bedtime.  . ferrous sulfate 325 (65 FE) MG tablet 90 tablet 3    Sig: Take 1 tablet (325 mg total) by mouth daily with breakfast.  . gabapentin (NEURONTIN) 100 MG capsule 180 capsule 3    Sig: Take 2 capsules (200 mg total) by mouth 3 (three) times daily.  Marland Kitchen thyroid (NP THYROID) 60 MG tablet 90 tablet 3    Sig: Take 1 tablet (60 mg total) by mouth daily.  Marland Kitchen omeprazole (PRILOSEC) 40 MG capsule 180 capsule 3    Sig: Take 1 capsule (40 mg total) by mouth 2 (two) times daily.  . Vitamin D, Ergocalciferol, (DRISDOL) 1.25 MG (50000 UNIT) CAPS capsule 12 capsule 0    Sig: Take 1 capsule (50,000 Units total) by mouth once a week.

## 2020-04-02 ENCOUNTER — Encounter: Payer: Self-pay | Admitting: Family Medicine

## 2020-04-22 ENCOUNTER — Other Ambulatory Visit: Payer: Self-pay | Admitting: Internal Medicine

## 2020-05-22 ENCOUNTER — Ambulatory Visit: Payer: BC Managed Care – PPO | Admitting: Internal Medicine

## 2020-06-06 ENCOUNTER — Other Ambulatory Visit: Payer: Self-pay | Admitting: Family

## 2021-01-13 ENCOUNTER — Encounter: Payer: Self-pay | Admitting: Gastroenterology
